# Patient Record
Sex: Female | Born: 1955 | Race: Asian | Hispanic: No | Marital: Single | Smoking: Never smoker
Health system: Southern US, Community
[De-identification: ages and names within clinical notes are randomized; demographics above are authoritative.]

## PROBLEM LIST (undated history)

## (undated) DIAGNOSIS — T7840XA Allergy, unspecified, initial encounter: Secondary | ICD-10-CM

## (undated) DIAGNOSIS — B181 Chronic viral hepatitis B without delta-agent: Secondary | ICD-10-CM

## (undated) HISTORY — DX: Allergy, unspecified, initial encounter: T78.40XA

## (undated) HISTORY — PX: BREAST SURGERY: SHX581

## (undated) HISTORY — PX: BREAST BIOPSY: SHX20

---

## 2006-01-16 ENCOUNTER — Ambulatory Visit: Payer: Self-pay

## 2006-07-18 ENCOUNTER — Ambulatory Visit: Payer: Self-pay | Admitting: Gastroenterology

## 2006-07-20 ENCOUNTER — Ambulatory Visit: Payer: Self-pay | Admitting: Gastroenterology

## 2007-01-23 ENCOUNTER — Ambulatory Visit: Payer: Self-pay

## 2007-03-14 ENCOUNTER — Ambulatory Visit: Payer: Self-pay | Admitting: Family Medicine

## 2010-09-14 ENCOUNTER — Ambulatory Visit: Payer: Self-pay | Admitting: Family Medicine

## 2012-05-29 ENCOUNTER — Ambulatory Visit: Payer: Self-pay | Admitting: Family Medicine

## 2013-03-17 ENCOUNTER — Ambulatory Visit: Payer: Self-pay | Admitting: Family Medicine

## 2014-12-09 ENCOUNTER — Ambulatory Visit: Payer: Self-pay | Admitting: Family Medicine

## 2015-03-31 ENCOUNTER — Ambulatory Visit: Payer: Self-pay | Admitting: Family Medicine

## 2015-03-31 ENCOUNTER — Encounter: Payer: Self-pay | Admitting: Family Medicine

## 2015-03-31 ENCOUNTER — Ambulatory Visit (INDEPENDENT_AMBULATORY_CARE_PROVIDER_SITE_OTHER): Payer: BLUE CROSS/BLUE SHIELD | Admitting: Family Medicine

## 2015-03-31 ENCOUNTER — Other Ambulatory Visit: Payer: Self-pay

## 2015-03-31 VITALS — BP 120/78 | HR 78 | Temp 97.6°F | Ht 64.0 in | Wt 123.0 lb

## 2015-03-31 DIAGNOSIS — D72819 Decreased white blood cell count, unspecified: Secondary | ICD-10-CM | POA: Diagnosis not present

## 2015-03-31 DIAGNOSIS — J40 Bronchitis, not specified as acute or chronic: Secondary | ICD-10-CM

## 2015-03-31 MED ORDER — CLARITHROMYCIN 250 MG PO TABS: 250.0000 mg | ORAL_TABLET | Freq: Two times a day (BID) | ORAL | Status: AC

## 2015-03-31 NOTE — Progress Notes (Signed)
Name: Tiffany Clements   MRN: [id number]    DOB: Mar 30, 1956   Date:03/31/2015       Progress Note  Subjective  Chief Complaint  Chief Complaint  Patient presents with  . Cough    got back from [place] 5 days ago after a month and half stay- cough and low grade fever started in [place]    Cough This is a recurrent problem. The current episode started more than 1 month ago. The problem has been unchanged. The problem occurs constantly. The cough is productive of purulent sputum. Associated symptoms include shortness of breath and wheezing. Pertinent negatives include no chest pain, chills, ear congestion, ear pain, fever, headaches, heartburn, hemoptysis, myalgias, nasal congestion, postnasal drip, rash, rhinorrhea, sore throat, sweats or weight loss. The symptoms are aggravated by other (air quality). Risk factors for lung disease include travel. She has tried nothing for the symptoms. Her past medical history is significant for asthma and environmental allergies. There is no history of bronchiectasis, bronchitis, COPD, emphysema or pneumonia.    No problem-specific assessment & plan notes found for this encounter.   Past Medical History  Diagnosis Date  . Allergy     History reviewed. No pertinent past surgical history.  History reviewed. No pertinent family history.  History   Social History  . Marital Status: Single    Spouse Name: N/A  . Number of Children: N/A  . Years of Education: N/A   Occupational History  . Not on file.   Social History Main Topics  . Smoking status: Never Smoker   . Smokeless tobacco: Not on file  . Alcohol Use: No  . Drug Use: No  . Sexual Activity: No   Other Topics Concern  . Not on file   Social History Narrative  . No narrative on file    No Known Allergies   Review of Systems  Constitutional: Negative for fever, chills and weight loss.  HENT: Negative for ear pain, postnasal drip, rhinorrhea and sore throat.   Respiratory: Positive for  cough, sputum production, shortness of breath and wheezing. Negative for hemoptysis.   Cardiovascular: Positive for orthopnea. Negative for chest pain, palpitations, leg swelling and PND.  Gastrointestinal: Negative for heartburn.  Musculoskeletal: Negative for myalgias.  Skin: Negative for rash.  Neurological: Negative for dizziness and headaches.  Endo/Heme/Allergies: Positive for environmental allergies.     Objective  Filed Vitals:   03/31/15 1517  BP: 120/78  Pulse: 78  Temp: 97.6 F (36.4 C)  TempSrc: Oral  Height: 5\' 4"  (1.626 m)  Weight: 123 lb (55.792 kg)    Physical Exam  Constitutional: She is well-developed, well-nourished, and in no distress.  HENT:  Head: Normocephalic and atraumatic.  Right Ear: External ear normal.  Left Ear: External ear normal.  Mouth/Throat: Oropharynx is clear and moist.  Eyes: Conjunctivae and EOM are normal. Pupils are equal, round, and reactive to light.  Neck: Normal range of motion. Neck supple.  Cardiovascular: Normal rate, regular rhythm, normal heart sounds and intact distal pulses.   No murmur heard. Pulmonary/Chest: Effort normal and breath sounds normal.  Abdominal: Soft. Bowel sounds are normal.  Musculoskeletal: Normal range of motion.  Lymphadenopathy:    She has no cervical adenopathy.  Skin: Skin is warm and dry.  Psychiatric: Mood and affect normal.      No results found for this or any previous visit (from the past 2160 hour(s)).   Assessment & Plan  Problem List Items Addressed This Visit  None    Visit Diagnoses    Bronchitis    -  Primary    breo sample given    Relevant Medications    clarithromycin (BIAXIN) 250 MG tablet    Leukopenia        Relevant Orders    CBC w/Diff         Dr. Otilio Miu [facility]  03/31/2015

## 2015-04-01 LAB — CBC WITH DIFFERENTIAL/PLATELET
BASOS ABS: 0.1 10*3/uL (ref 0.0–0.2)
BASOS: 1 %
EOS (ABSOLUTE): 0.2 10*3/uL (ref 0.0–0.4)
EOS: 3 %
HEMATOCRIT: 37.8 % (ref 34.0–46.6)
HEMOGLOBIN: 12.4 g/dL (ref 11.1–15.9)
IMMATURE GRANULOCYTES: 0 %
Immature Grans (Abs): 0 10*3/uL (ref 0.0–0.1)
Lymphocytes Absolute: 2.8 10*3/uL (ref 0.7–3.1)
Lymphs: 47 %
MCH: 30.5 pg (ref 26.6–33.0)
MCHC: 32.8 g/dL (ref 31.5–35.7)
MCV: 93 fL (ref 79–97)
MONOCYTES: 9 %
Monocytes Absolute: 0.5 10*3/uL (ref 0.1–0.9)
NEUTROS ABS: 2.4 10*3/uL (ref 1.4–7.0)
Neutrophils: 40 %
Platelets: 250 10*3/uL (ref 150–379)
RBC: 4.07 x10E6/uL (ref 3.77–5.28)
RDW: 13.7 % (ref 12.3–15.4)
WBC: 5.9 10*3/uL (ref 3.4–10.8)

## 2015-06-08 ENCOUNTER — Encounter: Payer: Self-pay | Admitting: Family Medicine

## 2015-06-08 ENCOUNTER — Ambulatory Visit (INDEPENDENT_AMBULATORY_CARE_PROVIDER_SITE_OTHER): Payer: BLUE CROSS/BLUE SHIELD | Admitting: Family Medicine

## 2015-06-08 VITALS — BP 120/80 | HR 60 | Temp 98.0°F | Ht 62.0 in | Wt 120.0 lb

## 2015-06-08 DIAGNOSIS — J019 Acute sinusitis, unspecified: Secondary | ICD-10-CM | POA: Diagnosis not present

## 2015-06-08 MED ORDER — AMOXICILLIN 500 MG PO CAPS: 500.0000 mg | ORAL_CAPSULE | Freq: Three times a day (TID) | ORAL | Status: DC

## 2015-06-08 NOTE — Progress Notes (Signed)
Name: Tiffany Clements   MRN: [id number]    DOB: [birth year]   Date:06/08/2015       Progress Note  Subjective  Chief Complaint  Chief Complaint  Patient presents with  . Sinusitis    dizzy/ nauseated  . Sore Throat    Sinusitis This is a new problem. The current episode started in the past 7 days. The problem has been gradually worsening since onset. The maximum temperature recorded prior to her arrival was 100.4 - 100.9 F. Associated symptoms include chills, headaches, sinus pressure, a sore throat and swollen glands. Pertinent negatives include no congestion, coughing, diaphoresis, ear pain, hoarse voice, neck pain, shortness of breath or sneezing. Past treatments include nothing. The treatment provided mild relief.  Sore Throat  Associated symptoms include headaches and swollen glands. Pertinent negatives include no abdominal pain, congestion, coughing, diarrhea, ear discharge, ear pain, hoarse voice, neck pain or shortness of breath.    No problem-specific assessment & plan notes found for this encounter.   Past Medical History  Diagnosis Date  . Allergy     History reviewed. No pertinent past surgical history.  History reviewed. No pertinent family history.  Social History   Social History  . Marital Status: Single    Spouse Name: N/A  . Number of Children: N/A  . Years of Education: N/A   Occupational History  . Not on file.   Social History Main Topics  . Smoking status: Never Smoker   . Smokeless tobacco: Not on file  . Alcohol Use: No  . Drug Use: No  . Sexual Activity: No   Other Topics Concern  . Not on file   Social History Narrative    No Known Allergies   Review of Systems  Constitutional: Positive for chills. Negative for fever, weight loss, malaise/fatigue and diaphoresis.  HENT: Positive for sinus pressure and sore throat. Negative for congestion, ear discharge, ear pain, hearing loss, hoarse voice and sneezing.   Eyes: Negative for blurred  vision.  Respiratory: Negative for cough, sputum production, shortness of breath and wheezing.   Cardiovascular: Negative for chest pain, palpitations and leg swelling.  Gastrointestinal: Positive for nausea. Negative for heartburn, abdominal pain, diarrhea, constipation, blood in stool and melena.  Genitourinary: Negative for dysuria, urgency, frequency and hematuria.  Musculoskeletal: Negative for myalgias, back pain, joint pain and neck pain.  Skin: Negative for rash.  Neurological: Positive for headaches. Negative for dizziness, tingling, sensory change and focal weakness.  Endo/Heme/Allergies: Negative for environmental allergies and polydipsia. Does not bruise/bleed easily.  Psychiatric/Behavioral: Negative for depression and suicidal ideas. The patient is not nervous/anxious and does not have insomnia.      Objective  Filed Vitals:   06/08/15 1339  BP: 120/80  Pulse: 60  Temp: 98 F (36.7 C)  TempSrc: Oral  Height: 5\' 2"  (1.575 m)  Weight: 120 lb (54.432 kg)    Physical Exam  Constitutional: She is well-developed, well-nourished, and in no distress. No distress.  HENT:  Head: Normocephalic and atraumatic.  Right Ear: External ear normal.  Left Ear: External ear normal.  Nose: Nose normal.  Mouth/Throat: Posterior oropharyngeal erythema present.  Eyes: Conjunctivae and EOM are normal. Pupils are equal, round, and reactive to light. Right eye exhibits no discharge. Left eye exhibits no discharge.  Neck: Normal range of motion. Neck supple. No JVD present. No thyromegaly present.  Cardiovascular: Normal rate, regular rhythm, normal heart sounds and intact distal pulses.  Exam reveals no gallop and no friction  rub.   No murmur heard. Pulmonary/Chest: Effort normal and breath sounds normal. No respiratory distress.  Abdominal: Soft. Bowel sounds are normal. She exhibits no mass. There is no tenderness. There is no guarding.  Musculoskeletal: Normal range of motion. She  exhibits no edema.  Lymphadenopathy:    She has no cervical adenopathy.  Neurological: She is alert. She has normal reflexes.  Skin: Skin is warm and dry. She is not diaphoretic.  Psychiatric: Mood and affect normal.      Assessment & Plan  Problem List Items Addressed This Visit    None    Visit Diagnoses    Acute sinusitis, recurrence not specified, unspecified location    -  Primary    Relevant Medications    amoxicillin (AMOXIL) 500 MG capsule         Dr. [facility]  06/08/2015

## 2015-06-22 ENCOUNTER — Ambulatory Visit (INDEPENDENT_AMBULATORY_CARE_PROVIDER_SITE_OTHER): Payer: BLUE CROSS/BLUE SHIELD | Admitting: Family Medicine

## 2015-06-22 ENCOUNTER — Encounter: Payer: Self-pay | Admitting: Family Medicine

## 2015-06-22 VITALS — BP 122/62 | HR 72 | Ht 62.0 in | Wt 121.0 lb

## 2015-06-22 DIAGNOSIS — S29011A Strain of muscle and tendon of front wall of thorax, initial encounter: Secondary | ICD-10-CM

## 2015-06-22 DIAGNOSIS — J029 Acute pharyngitis, unspecified: Secondary | ICD-10-CM

## 2015-06-22 DIAGNOSIS — S2341XA Sprain of ribs, initial encounter: Secondary | ICD-10-CM

## 2015-06-22 MED ORDER — AZITHROMYCIN 250 MG PO TABS: ORAL_TABLET | ORAL | Status: DC

## 2015-06-22 MED ORDER — TRAMADOL HCL 50 MG PO TABS: 50.0000 mg | ORAL_TABLET | Freq: Three times a day (TID) | ORAL | Status: DC | PRN

## 2015-06-22 NOTE — Progress Notes (Signed)
Name: Tiffany Clements   MRN: [id number]    DOB: January 08, 1956   Date:06/22/2015       Progress Note  Subjective  Chief Complaint  Chief Complaint  Patient presents with  . Breast Pain    pain off and on x couple of days    Chest Pain  This is a new problem. The current episode started in the past 7 days. The problem occurs 2 to 4 times per day. The problem has been waxing and waning. The pain is present in the lateral region. The pain is at a severity of 5/10. The pain is moderate. The quality of the pain is described as sharp (with palpation). The pain does not radiate. Pertinent negatives include no abdominal pain, back pain, cough, dizziness, fever, headaches, irregular heartbeat, malaise/fatigue, nausea, numbness, palpitations, shortness of breath or sputum production.  Sore Throat  This is a new problem. The current episode started in the past 7 days. The problem has been gradually worsening. There has been no fever. The pain is mild. Pertinent negatives include no abdominal pain, coughing, diarrhea, ear discharge, ear pain, headaches, hoarse voice, neck pain, shortness of breath or swollen glands. She has tried acetaminophen for the symptoms.    No problem-specific assessment & plan notes found for this encounter.   Past Medical History  Diagnosis Date  . Allergy     History reviewed. No pertinent past surgical history.  History reviewed. No pertinent family history.  Social History   Social History  . Marital Status: Single    Spouse Name: N/A  . Number of Children: N/A  . Years of Education: N/A   Occupational History  . Not on file.   Social History Main Topics  . Smoking status: Never Smoker   . Smokeless tobacco: Not on file  . Alcohol Use: No  . Drug Use: No  . Sexual Activity: No   Other Topics Concern  . Not on file   Social History Narrative    No Known Allergies   Review of Systems  Constitutional: Negative for fever, chills, weight loss and  malaise/fatigue.  HENT: Positive for sore throat. Negative for ear discharge, ear pain and hoarse voice.   Eyes: Negative for blurred vision.  Respiratory: Negative for cough, sputum production, shortness of breath and wheezing.   Cardiovascular: Positive for chest pain. Negative for palpitations and leg swelling.       Chest wall pain/tenderness beneath left breast  Gastrointestinal: Negative for heartburn, nausea, abdominal pain, diarrhea, constipation, blood in stool and melena.  Genitourinary: Negative for dysuria, urgency, frequency and hematuria.  Musculoskeletal: Negative for myalgias, back pain, joint pain and neck pain.  Skin: Negative for rash.  Neurological: Negative for dizziness, tingling, sensory change, focal weakness, numbness and headaches.  Endo/Heme/Allergies: Negative for environmental allergies and polydipsia. Does not bruise/bleed easily.  Psychiatric/Behavioral: Negative for depression and suicidal ideas. The patient is not nervous/anxious and does not have insomnia.      Objective  Filed Vitals:   06/22/15 1517  BP: 122/62  Pulse: 72  Height: 5\' 2"  (1.575 m)  Weight: 121 lb (54.885 kg)    Physical Exam  Constitutional: She is well-developed, well-nourished, and in no distress. No distress.  HENT:  Head: Normocephalic and atraumatic.  Right Ear: External ear normal.  Left Ear: External ear normal.  Nose: Nose normal.  Mouth/Throat: Posterior oropharyngeal erythema present.  Eyes: Conjunctivae and EOM are normal. Pupils are equal, round, and reactive to light. Right eye  exhibits no discharge. Left eye exhibits no discharge.  Neck: Normal range of motion. Neck supple. No JVD present. No thyromegaly present.  Cardiovascular: Normal rate, regular rhythm, normal heart sounds and intact distal pulses.  Exam reveals no gallop and no friction rub.   No murmur heard. Pulmonary/Chest: Effort normal and breath sounds normal. She exhibits tenderness and bony  tenderness. She exhibits no edema, no deformity and no swelling. Right breast exhibits no mass, no skin change and no tenderness. Left breast exhibits no mass, no skin change and no tenderness.    Abdominal: Soft. Bowel sounds are normal. She exhibits no mass. There is no tenderness. There is no guarding.  Musculoskeletal: Normal range of motion. She exhibits no edema.  Lymphadenopathy:    She has no cervical adenopathy.  Neurological: She is alert. She has normal reflexes.  Skin: Skin is warm and dry. She is not diaphoretic.  Psychiatric: Mood and affect normal.      Assessment & Plan  Problem List Items Addressed This Visit    None    Visit Diagnoses    Intercostal muscle strain, initial encounter    -  Primary    may take aleve    Relevant Medications    traMADol (ULTRAM) 50 MG tablet    Pharyngitis        Relevant Medications    azithromycin (ZITHROMAX) 250 MG tablet         Dr. [facility]  06/22/2015

## 2015-09-15 ENCOUNTER — Ambulatory Visit (INDEPENDENT_AMBULATORY_CARE_PROVIDER_SITE_OTHER): Payer: BLUE CROSS/BLUE SHIELD | Admitting: Family Medicine

## 2015-09-15 ENCOUNTER — Encounter: Payer: Self-pay | Admitting: Family Medicine

## 2015-09-15 VITALS — BP 110/70 | HR 78 | Temp 98.3°F | Ht 62.0 in | Wt 121.0 lb

## 2015-09-15 DIAGNOSIS — M940 Chondrocostal junction syndrome [Tietze]: Secondary | ICD-10-CM

## 2015-09-15 DIAGNOSIS — J029 Acute pharyngitis, unspecified: Secondary | ICD-10-CM

## 2015-09-15 DIAGNOSIS — J40 Bronchitis, not specified as acute or chronic: Secondary | ICD-10-CM | POA: Diagnosis not present

## 2015-09-15 DIAGNOSIS — J0101 Acute recurrent maxillary sinusitis: Secondary | ICD-10-CM | POA: Diagnosis not present

## 2015-09-15 MED ORDER — AZITHROMYCIN 250 MG PO TABS: ORAL_TABLET | ORAL | Status: DC

## 2015-09-15 MED ORDER — GUAIFENESIN-CODEINE 100-10 MG/5ML PO SOLN: 5.0000 mL | Freq: Three times a day (TID) | ORAL | Status: DC | PRN

## 2015-09-15 NOTE — Progress Notes (Signed)
Name: Tiffany Clements   MRN: [id number]    DOB: [birth year]   Date:09/15/2015       Progress Note  Subjective  Chief Complaint  Chief Complaint  Patient presents with  . Sinusitis    cough, chills, sore throat, runny nose x 2 days    Sinusitis This is a new problem. The current episode started in the past 7 days. The problem has been waxing and waning since onset. There has been no fever. Associated symptoms include chills, congestion, coughing, headaches, shortness of breath, sinus pressure and a sore throat. Pertinent negatives include no diaphoresis, ear pain, hoarse voice, neck pain, sneezing or swollen glands. Past treatments include acetaminophen. The treatment provided no relief.  Cough The current episode started in the past 7 days. The problem has been waxing and waning. The cough is non-productive. Associated symptoms include chills, headaches, a sore throat and shortness of breath. Pertinent negatives include no chest pain, ear pain, fever, heartburn, myalgias, rash, weight loss or wheezing. The symptoms are aggravated by cold air. The treatment provided no relief. There is no history of environmental allergies.    No problem-specific assessment & plan notes found for this encounter.   Past Medical History  Diagnosis Date  . Allergy     History reviewed. No pertinent past surgical history.  History reviewed. No pertinent family history.  Social History   Social History  . Marital Status: Single    Spouse Name: N/A  . Number of Children: N/A  . Years of Education: N/A   Occupational History  . Not on file.   Social History Main Topics  . Smoking status: Never Smoker   . Smokeless tobacco: Not on file  . Alcohol Use: No  . Drug Use: No  . Sexual Activity: No   Other Topics Concern  . Not on file   Social History Narrative    No Known Allergies   Review of Systems  Constitutional: Positive for chills. Negative for fever, weight loss, malaise/fatigue and  diaphoresis.  HENT: Positive for congestion, sinus pressure and sore throat. Negative for ear discharge, ear pain, hoarse voice and sneezing.   Eyes: Negative for blurred vision.  Respiratory: Positive for cough and shortness of breath. Negative for sputum production and wheezing.   Cardiovascular: Negative for chest pain, palpitations and leg swelling.  Gastrointestinal: Negative for heartburn, nausea, abdominal pain, diarrhea, constipation, blood in stool and melena.  Genitourinary: Negative for dysuria, urgency, frequency and hematuria.  Musculoskeletal: Negative for myalgias, back pain, joint pain and neck pain.  Skin: Negative for rash.  Neurological: Positive for headaches. Negative for dizziness, tingling, sensory change and focal weakness.  Endo/Heme/Allergies: Negative for environmental allergies and polydipsia. Does not bruise/bleed easily.  Psychiatric/Behavioral: Negative for depression and suicidal ideas. The patient is not nervous/anxious and does not have insomnia.      Objective  Filed Vitals:   09/15/15 1413  BP: 110/70  Pulse: 78  Temp: 98.3 F (36.8 C)  TempSrc: Oral  Height: 5\' 2"  (1.575 m)  Weight: 121 lb (54.885 kg)    Physical Exam  Constitutional: She is well-developed, well-nourished, and in no distress. No distress.  HENT:  Head: Normocephalic and atraumatic.  Right Ear: External ear normal.  Left Ear: External ear normal.  Nose: Nose normal.  Mouth/Throat: Oropharynx is clear and moist.  Eyes: Conjunctivae and EOM are normal. Pupils are equal, round, and reactive to light. Right eye exhibits no discharge. Left eye exhibits no discharge.  Neck:  Normal range of motion. Neck supple. No JVD present. No thyromegaly present.  Cardiovascular: Normal rate, regular rhythm, normal heart sounds and intact distal pulses.  Exam reveals no gallop and no friction rub.   No murmur heard. Pulmonary/Chest: Effort normal and breath sounds normal. No respiratory  distress. She has no wheezes. She has no rales. She exhibits tenderness.  Abdominal: Soft. Bowel sounds are normal. She exhibits no mass. There is no tenderness. There is no guarding.  Musculoskeletal: Normal range of motion. She exhibits no edema.  Lymphadenopathy:    She has no cervical adenopathy.  Neurological: She is alert. She has normal reflexes.  Skin: Skin is warm and dry. She is not diaphoretic.  Psychiatric: Mood and affect normal.      Assessment & Plan  Problem List Items Addressed This Visit    None    Visit Diagnoses    Bronchitis    -  Primary    Relevant Medications    guaiFENesin-codeine 100-10 MG/5ML syrup    Acute recurrent maxillary sinusitis        Relevant Medications    guaiFENesin-codeine 100-10 MG/5ML syrup    azithromycin (ZITHROMAX) 250 MG tablet    Costochondritis        Pharyngitis        Relevant Medications    azithromycin (ZITHROMAX) 250 MG tablet         Dr. [facility]  09/15/2015

## 2015-09-21 ENCOUNTER — Ambulatory Visit (INDEPENDENT_AMBULATORY_CARE_PROVIDER_SITE_OTHER): Payer: BLUE CROSS/BLUE SHIELD | Admitting: Family Medicine

## 2015-09-21 ENCOUNTER — Encounter: Payer: Self-pay | Admitting: Family Medicine

## 2015-09-21 VITALS — BP 122/80 | HR 74 | Temp 97.7°F | Ht 62.0 in | Wt 123.0 lb

## 2015-09-21 DIAGNOSIS — D72819 Decreased white blood cell count, unspecified: Secondary | ICD-10-CM

## 2015-09-21 DIAGNOSIS — L042 Acute lymphadenitis of upper limb: Secondary | ICD-10-CM | POA: Diagnosis not present

## 2015-09-21 DIAGNOSIS — L03012 Cellulitis of left finger: Secondary | ICD-10-CM

## 2015-09-21 DIAGNOSIS — S61341A Puncture wound with foreign body of left index finger with damage to nail, initial encounter: Secondary | ICD-10-CM | POA: Diagnosis not present

## 2015-09-21 MED ORDER — SULFAMETHOXAZOLE-TRIMETHOPRIM 800-160 MG PO TABS: 1.0000 | ORAL_TABLET | Freq: Two times a day (BID) | ORAL | Status: DC

## 2015-09-21 NOTE — Progress Notes (Signed)
Name: Tiffany Clements   MRN: [id number]    DOB: 1956/09/01   Date:09/21/2015       Progress Note  Subjective  Chief Complaint  Chief Complaint  Patient presents with  . Hand Pain    "poked finger with a fish bone 15 days ago"- now red and irritated and has noticed some sweeling on the inside of L) arm    Hand Pain  The incident occurred 3 to 5 days ago. Injury mechanism: puncture wound left index finger distal/dorsul aspect. The pain is present in the left fingers and left elbow. The quality of the pain is described as aching. The pain is at a severity of 4/10. The pain is mild. Pertinent negatives include no chest pain or tingling. The treatment provided moderate relief.    No problem-specific assessment & plan notes found for this encounter.   Past Medical History  Diagnosis Date  . Allergy     History reviewed. No pertinent past surgical history.  History reviewed. No pertinent family history.  Social History   Social History  . Marital Status: Single    Spouse Name: N/A  . Number of Children: N/A  . Years of Education: N/A   Occupational History  . Not on file.   Social History Main Topics  . Smoking status: Never Smoker   . Smokeless tobacco: Not on file  . Alcohol Use: No  . Drug Use: No  . Sexual Activity: No   Other Topics Concern  . Not on file   Social History Narrative    No Known Allergies   Review of Systems  Constitutional: Negative for fever, chills, weight loss and malaise/fatigue.  HENT: Negative for ear discharge, ear pain and sore throat.   Eyes: Negative for blurred vision.  Respiratory: Negative for cough, sputum production, shortness of breath and wheezing.   Cardiovascular: Negative for chest pain, palpitations and leg swelling.  Gastrointestinal: Negative for heartburn, nausea, abdominal pain, diarrhea, constipation, blood in stool and melena.  Genitourinary: Negative for dysuria, urgency, frequency and hematuria.  Musculoskeletal:  Negative for myalgias, back pain, joint pain and neck pain.  Skin: Negative for rash.  Neurological: Negative for dizziness, tingling, sensory change, focal weakness and headaches.  Endo/Heme/Allergies: Negative for environmental allergies and polydipsia. Does not bruise/bleed easily.  Psychiatric/Behavioral: Negative for depression and suicidal ideas. The patient is not nervous/anxious and does not have insomnia.      Objective  Filed Vitals:   09/21/15 1045  BP: 122/80  Pulse: 74  Temp: 97.7 F (36.5 C)  TempSrc: Oral  Height: 5\' 2"  (1.575 m)  Weight: 123 lb (55.792 kg)    Physical Exam  Constitutional: She is well-developed, well-nourished, and in no distress. No distress.  HENT:  Head: Normocephalic and atraumatic.  Right Ear: External ear normal.  Left Ear: External ear normal.  Nose: Nose normal.  Mouth/Throat: Oropharynx is clear and moist.  Eyes: Conjunctivae and EOM are normal. Pupils are equal, round, and reactive to light. Right eye exhibits no discharge. Left eye exhibits no discharge.  Neck: Normal range of motion. Neck supple. No JVD present. No thyromegaly present.  Cardiovascular: Normal rate, regular rhythm, normal heart sounds and intact distal pulses.  Exam reveals no gallop and no friction rub.   No murmur heard. Pulmonary/Chest: Effort normal and breath sounds normal.  Abdominal: Soft. Bowel sounds are normal. She exhibits no mass. There is no tenderness. There is no guarding.  Musculoskeletal: Normal range of motion. She exhibits no  edema.       Left elbow: She exhibits swelling. Tenderness found.       Arms:      Left hand: She exhibits tenderness and swelling.       Hands: Palpable epithroceler node/tender  Lymphadenopathy:    She has no cervical adenopathy.  Neurological: She is alert.  Skin: Skin is warm and dry. No rash noted. She is not diaphoretic. There is erythema. No pallor.  Psychiatric: Mood and affect normal.      Assessment &  Plan  Problem List Items Addressed This Visit    None    Visit Diagnoses    Puncture wound of left index finger with foreign body and damage to nail, initial encounter    -  Primary    Relevant Medications    sulfamethoxazole-trimethoprim (BACTRIM DS,SEPTRA DS) 800-160 MG tablet    Cellulitis of finger of left hand        Relevant Medications    sulfamethoxazole-trimethoprim (BACTRIM DS,SEPTRA DS) 800-160 MG tablet    Other Relevant Orders    CBC with Differential    Acute lymphadenitis of arm        Relevant Medications    sulfamethoxazole-trimethoprim (BACTRIM DS,SEPTRA DS) 800-160 MG tablet    Leukopenia        Relevant Orders    CBC with Differential         Dr. Chistopher [facility]  09/21/2015

## 2015-09-21 NOTE — Patient Instructions (Signed)

## 2015-09-22 LAB — CBC WITH DIFFERENTIAL/PLATELET
Basophils Absolute: 0.1 10*3/uL (ref 0.0–0.2)
Basos: 2 %
EOS (ABSOLUTE): 0.1 10*3/uL (ref 0.0–0.4)
EOS: 2 %
HEMATOCRIT: 38.8 % (ref 34.0–46.6)
HEMOGLOBIN: 12.9 g/dL (ref 11.1–15.9)
IMMATURE GRANS (ABS): 0 10*3/uL (ref 0.0–0.1)
Immature Granulocytes: 0 %
Lymphocytes Absolute: 1.9 10*3/uL (ref 0.7–3.1)
Lymphs: 40 %
MCH: 32.2 pg (ref 26.6–33.0)
MCHC: 33.2 g/dL (ref 31.5–35.7)
MCV: 97 fL (ref 79–97)
MONOCYTES: 12 %
MONOS ABS: 0.6 10*3/uL (ref 0.1–0.9)
NEUTROS PCT: 44 %
Neutrophils Absolute: 2.1 10*3/uL (ref 1.4–7.0)
Platelets: 238 10*3/uL (ref 150–379)
RBC: 4.01 x10E6/uL (ref 3.77–5.28)
RDW: 13.3 % (ref 12.3–15.4)
WBC: 4.6 10*3/uL (ref 3.4–10.8)

## 2015-10-01 ENCOUNTER — Ambulatory Visit (INDEPENDENT_AMBULATORY_CARE_PROVIDER_SITE_OTHER): Payer: BLUE CROSS/BLUE SHIELD | Admitting: Family Medicine

## 2015-10-01 ENCOUNTER — Encounter: Payer: Self-pay | Admitting: Family Medicine

## 2015-10-01 VITALS — BP 120/80 | HR 64 | Ht 62.0 in | Wt 123.0 lb

## 2015-10-01 DIAGNOSIS — S61239D Puncture wound without foreign body of unspecified finger without damage to nail, subsequent encounter: Secondary | ICD-10-CM | POA: Diagnosis not present

## 2015-10-01 NOTE — Progress Notes (Signed)
Name: Tiffany Clements   MRN: [id number]    DOB: [birth year]   Date:10/01/2015       Progress Note  Subjective  Chief Complaint  Chief Complaint  Patient presents with  . Follow-up    L) arm was swollen- took last antibiotic this am    Hand Pain  The incident occurred 3 to 5 days ago. The pain is present in the left fingers. The pain is at a severity of 0/10. The patient is experiencing no pain. Pertinent negatives include no chest pain, muscle weakness, numbness or tingling.    No problem-specific assessment & plan notes found for this encounter.   Past Medical History  Diagnosis Date  . Allergy     History reviewed. No pertinent past surgical history.  History reviewed. No pertinent family history.  Social History   Social History  . Marital Status: Single    Spouse Name: N/A  . Number of Children: N/A  . Years of Education: N/A   Occupational History  . Not on file.   Social History Main Topics  . Smoking status: Never Smoker   . Smokeless tobacco: Not on file  . Alcohol Use: No  . Drug Use: No  . Sexual Activity: No   Other Topics Concern  . Not on file   Social History Narrative    No Known Allergies   Review of Systems  Constitutional: Negative for fever, chills, weight loss and malaise/fatigue.  HENT: Negative for ear discharge, ear pain and sore throat.   Eyes: Negative for blurred vision.  Respiratory: Negative for cough, sputum production, shortness of breath and wheezing.   Cardiovascular: Negative for chest pain, palpitations and leg swelling.  Gastrointestinal: Negative for heartburn, nausea, abdominal pain, diarrhea, constipation, blood in stool and melena.  Genitourinary: Negative for dysuria, urgency, frequency and hematuria.  Musculoskeletal: Negative for myalgias, back pain, joint pain and neck pain.  Skin: Negative for rash.  Neurological: Negative for dizziness, tingling, sensory change, focal weakness, numbness and headaches.   Endo/Heme/Allergies: Negative for environmental allergies and polydipsia. Does not bruise/bleed easily.  Psychiatric/Behavioral: Negative for depression and suicidal ideas. The patient is not nervous/anxious and does not have insomnia.      Objective  Filed Vitals:   10/01/15 1354  BP: 120/80  Pulse: 64  Height: 5\' 2"  (1.575 m)  Weight: 123 lb (55.792 kg)    Physical Exam  Musculoskeletal: Normal range of motion. She exhibits no edema or tenderness.  Neurological: She has normal strength and normal reflexes.      Assessment & Plan  Problem List Items Addressed This Visit    None    Visit Diagnoses    Puncture wound of finger of left hand, subsequent encounter    -  Primary         Dr. Otilio Miu [facility]  10/01/2015

## 2015-10-21 ENCOUNTER — Other Ambulatory Visit: Payer: Self-pay

## 2015-10-22 ENCOUNTER — Encounter: Payer: Self-pay | Admitting: Family Medicine

## 2015-10-22 ENCOUNTER — Encounter: Payer: Self-pay | Admitting: Gastroenterology

## 2015-10-22 ENCOUNTER — Ambulatory Visit (INDEPENDENT_AMBULATORY_CARE_PROVIDER_SITE_OTHER): Payer: BLUE CROSS/BLUE SHIELD | Admitting: Family Medicine

## 2015-10-22 VITALS — BP 110/80 | HR 80 | Ht 62.0 in | Wt 122.0 lb

## 2015-10-22 DIAGNOSIS — R109 Unspecified abdominal pain: Secondary | ICD-10-CM | POA: Diagnosis not present

## 2015-10-22 NOTE — Progress Notes (Signed)
Name: Tiffany Clements   MRN: [id number]    DOB: Jun 13, 1956   Date:10/22/2015       Progress Note  Subjective  Chief Complaint  Chief Complaint  Patient presents with  . Abdominal Pain    upper, mid section of abdomen- pressure like pain x 3 days- BM normal    Abdominal Pain This is a recurrent problem. The current episode started in the past 7 days. The onset quality is gradual. The problem occurs constantly. The problem has been waxing and waning. The pain is located in the epigastric region. The pain is at a severity of 5/10. The pain is moderate. The quality of the pain is colicky. Pertinent negatives include no constipation, diarrhea, dysuria, fever, frequency, headaches, hematochezia, hematuria, melena, myalgias, nausea or weight loss. Nothing aggravates the pain. She has tried nothing for the symptoms. Prior diagnostic workup includes ultrasound. There is no history of abdominal surgery.    No problem-specific assessment & plan notes found for this encounter.   Past Medical History  Diagnosis Date  . Allergy     History reviewed. No pertinent past surgical history.  History reviewed. No pertinent family history.  Social History   Social History  . Marital Status: Single    Spouse Name: N/A  . Number of Children: N/A  . Years of Education: N/A   Occupational History  . Not on file.   Social History Main Topics  . Smoking status: Never Smoker   . Smokeless tobacco: Not on file  . Alcohol Use: No  . Drug Use: No  . Sexual Activity: No   Other Topics Concern  . Not on file   Social History Narrative    No Known Allergies   Review of Systems  Constitutional: Negative for fever, chills, weight loss and malaise/fatigue.  HENT: Negative for ear discharge, ear pain and sore throat.   Eyes: Negative for blurred vision.  Respiratory: Negative for cough, sputum production, shortness of breath and wheezing.   Cardiovascular: Negative for chest pain, palpitations and  leg swelling.  Gastrointestinal: Positive for abdominal pain. Negative for heartburn, nausea, diarrhea, constipation, blood in stool, melena and hematochezia.  Genitourinary: Negative for dysuria, urgency, frequency and hematuria.  Musculoskeletal: Negative for myalgias, back pain, joint pain and neck pain.  Skin: Negative for rash.  Neurological: Negative for dizziness, tingling, sensory change, focal weakness and headaches.  Endo/Heme/Allergies: Negative for environmental allergies and polydipsia. Does not bruise/bleed easily.  Psychiatric/Behavioral: Negative for depression and suicidal ideas. The patient is not nervous/anxious and does not have insomnia.      Objective  Filed Vitals:   10/22/15 1341  BP: 110/80  Pulse: 80  Height: 5\' 2"  (1.575 m)  Weight: 122 lb (55.339 kg)    Physical Exam  Constitutional: She is well-developed, well-nourished, and in no distress. No distress.  HENT:  Head: Normocephalic and atraumatic.  Right Ear: External ear normal.  Left Ear: External ear normal.  Nose: Nose normal.  Mouth/Throat: Oropharynx is clear and moist.  Eyes: Conjunctivae and EOM are normal. Pupils are equal, round, and reactive to light. Right eye exhibits no discharge. Left eye exhibits no discharge.  Neck: Normal range of motion. Neck supple. No JVD present. No thyromegaly present.  Cardiovascular: Normal rate, regular rhythm, normal heart sounds and intact distal pulses.  Exam reveals no gallop and no friction rub.   No murmur heard. Pulmonary/Chest: Effort normal and breath sounds normal.  Abdominal: Soft. Bowel sounds are normal. She exhibits no  mass. There is no tenderness. There is no guarding.  Musculoskeletal: Normal range of motion. She exhibits no edema.  Lymphadenopathy:    She has no cervical adenopathy.  Neurological: She is alert. She has normal reflexes.  Skin: Skin is warm and dry. She is not diaphoretic.  Psychiatric: Mood and affect normal.  Nursing note  and vitals reviewed.     Assessment & Plan  Problem List Items Addressed This Visit    None    Visit Diagnoses    Abdominal pain in female    -  Primary    Relevant Orders    Ambulatory referral to Gastroenterology         Dr. Otilio Miu [facility]  10/22/2015

## 2015-11-10 ENCOUNTER — Ambulatory Visit: Payer: BLUE CROSS/BLUE SHIELD | Admitting: Gastroenterology

## 2015-11-16 ENCOUNTER — Other Ambulatory Visit: Payer: Self-pay

## 2016-04-01 ENCOUNTER — Encounter: Payer: Self-pay | Admitting: Family Medicine

## 2016-04-01 ENCOUNTER — Ambulatory Visit (INDEPENDENT_AMBULATORY_CARE_PROVIDER_SITE_OTHER): Payer: BLUE CROSS/BLUE SHIELD | Admitting: Family Medicine

## 2016-04-01 VITALS — BP 120/70 | HR 60 | Ht 62.0 in | Wt 121.0 lb

## 2016-04-01 DIAGNOSIS — R1011 Right upper quadrant pain: Secondary | ICD-10-CM | POA: Diagnosis not present

## 2016-04-01 DIAGNOSIS — L918 Other hypertrophic disorders of the skin: Secondary | ICD-10-CM | POA: Diagnosis not present

## 2016-04-01 DIAGNOSIS — Z1159 Encounter for screening for other viral diseases: Secondary | ICD-10-CM | POA: Diagnosis not present

## 2016-04-01 DIAGNOSIS — Z8619 Personal history of other infectious and parasitic diseases: Secondary | ICD-10-CM

## 2016-04-01 DIAGNOSIS — L259 Unspecified contact dermatitis, unspecified cause: Secondary | ICD-10-CM

## 2016-04-01 MED ORDER — TRIAMCINOLONE ACETONIDE 0.1 % EX CREA: 1.0000 "application " | TOPICAL_CREAM | Freq: Two times a day (BID) | CUTANEOUS | Status: DC

## 2016-04-01 NOTE — Progress Notes (Signed)
Name: Tiffany Clements   MRN: [id number]    DOB: [birth year]   Date:04/01/2016       Progress Note  Subjective  Chief Complaint  Chief Complaint  Patient presents with  . Rash    itching on neck  . Abdominal Pain    wants a colonoscopy/ seen GI in past    HPI Comments: Patient has history of hepatitis B.  Rash This is a new problem. The current episode started in the past 7 days. The problem has been gradually worsening since onset. The affected locations include the neck. The rash is characterized by itchiness. She was exposed to nothing. Pertinent negatives include no cough, diarrhea, fever, joint pain, shortness of breath or sore throat. Past treatments include nothing. The treatment provided no relief.  Abdominal Pain This is a recurrent problem. The pain is located in the RUQ. The pain is at a severity of 3/10. The pain is mild. The quality of the pain is aching. The abdominal pain does not radiate. Associated symptoms include constipation. Pertinent negatives include no arthralgias, belching, diarrhea, dysuria, fever, frequency, headaches, hematuria, melena, myalgias, nausea or weight loss. Exacerbated by: certain foods. The pain is relieved by nothing. She has tried nothing for the symptoms. Prior workup: gi consult.    No problem-specific assessment & plan notes found for this encounter.   Past Medical History  Diagnosis Date  . Allergy     No past surgical history on file.  No family history on file.  Social History   Social History  . Marital Status: Single    Spouse Name: N/A  . Number of Children: N/A  . Years of Education: N/A   Occupational History  . Not on file.   Social History Main Topics  . Smoking status: Never Smoker   . Smokeless tobacco: Not on file  . Alcohol Use: No  . Drug Use: No  . Sexual Activity: No   Other Topics Concern  . Not on file   Social History Narrative    No Known Allergies   Review of Systems  Constitutional: Negative  for fever, chills, weight loss and malaise/fatigue.  HENT: Negative for ear discharge, ear pain and sore throat.   Eyes: Negative for blurred vision.  Respiratory: Negative for cough, sputum production, shortness of breath and wheezing.   Cardiovascular: Negative for chest pain, palpitations and leg swelling.  Gastrointestinal: Positive for abdominal pain and constipation. Negative for heartburn, nausea, diarrhea, blood in stool and melena.  Genitourinary: Negative for dysuria, urgency, frequency and hematuria.  Musculoskeletal: Negative for myalgias, back pain, joint pain, arthralgias and neck pain.  Skin: Positive for rash.  Neurological: Negative for dizziness, tingling, sensory change, focal weakness and headaches.  Endo/Heme/Allergies: Negative for environmental allergies and polydipsia. Does not bruise/bleed easily.  Psychiatric/Behavioral: Negative for depression and suicidal ideas. The patient is not nervous/anxious and does not have insomnia.      Objective  Filed Vitals:   04/01/16 1056  BP: 120/70  Pulse: 60  Height: 5\' 2"  (1.575 m)  Weight: 121 lb (54.885 kg)    Physical Exam  Constitutional: She is well-developed, well-nourished, and in no distress. No distress.  HENT:  Head: Normocephalic and atraumatic.  Right Ear: External ear normal.  Left Ear: External ear normal.  Nose: Nose normal.  Mouth/Throat: Oropharynx is clear and moist.  Eyes: Conjunctivae and EOM are normal. Pupils are equal, round, and reactive to light. Right eye exhibits no discharge. Left eye exhibits no discharge.  Neck: Normal range of motion. Neck supple. No JVD present. No thyromegaly present.  Cardiovascular: Normal rate, regular rhythm, normal heart sounds and intact distal pulses.  Exam reveals no gallop and no friction rub.   No murmur heard. Pulmonary/Chest: Effort normal and breath sounds normal.  Abdominal: Soft. Bowel sounds are normal. She exhibits no mass. There is no  hepatosplenomegaly. There is tenderness in the right upper quadrant. There is no rigidity, no rebound and no guarding.  Musculoskeletal: Normal range of motion. She exhibits no edema.  Lymphadenopathy:    She has no cervical adenopathy.  Neurological: She is alert. She has normal reflexes.  Skin: Skin is warm and dry. She is not diaphoretic.  Psychiatric: Mood and affect normal.  Nursing note and vitals reviewed.     Assessment & Plan  Problem List Items Addressed This Visit    None    Visit Diagnoses    Right upper quadrant pain    -  Primary    Relevant Orders    Hepatic function panel    Lipase    Hepatitis C antibody    Ambulatory referral to Gastroenterology    History of hepatitis B        Relevant Orders    Hepatic function panel    Lipase    Hepatitis B Surface AntiBODY    Hepatitis B Surface AntiGEN    Contact dermatitis        Relevant Medications    triamcinolone cream (KENALOG) 0.1 %    Cutaneous skin tags        Need for hepatitis C screening test        Relevant Orders    Hepatitis C antibody         Dr. Ramal Eckhardt [facility]  04/01/2016

## 2016-04-04 LAB — HEPATIC FUNCTION PANEL
ALBUMIN: 4.3 g/dL (ref 3.6–4.8)
ALT: 16 IU/L (ref 0–32)
AST: 19 IU/L (ref 0–40)
Alkaline Phosphatase: 95 IU/L (ref 39–117)
BILIRUBIN TOTAL: 0.4 mg/dL (ref 0.0–1.2)
BILIRUBIN, DIRECT: 0.12 mg/dL (ref 0.00–0.40)
Total Protein: 7.8 g/dL (ref 6.0–8.5)

## 2016-04-04 LAB — HEPATITIS C ANTIBODY

## 2016-04-04 LAB — HEPATITIS B SURFACE ANTIGEN

## 2016-04-04 LAB — HEPATITIS B SURFACE ANTIBODY,QUALITATIVE: HEP B SURFACE AB, QUAL: NONREACTIVE

## 2016-04-04 LAB — HEPATITIS B SURFACE AG, CONFIRM: HBsAg Confirmation: POSITIVE — AB

## 2016-04-04 LAB — LIPASE: Lipase: 49 U/L (ref 0–59)

## 2016-04-06 ENCOUNTER — Other Ambulatory Visit: Payer: Self-pay

## 2016-04-18 ENCOUNTER — Encounter: Payer: Self-pay | Admitting: Family Medicine

## 2016-04-18 ENCOUNTER — Ambulatory Visit (INDEPENDENT_AMBULATORY_CARE_PROVIDER_SITE_OTHER): Payer: BLUE CROSS/BLUE SHIELD | Admitting: Family Medicine

## 2016-04-18 VITALS — BP 120/80 | HR 80 | Resp 12 | Ht 62.0 in | Wt 120.0 lb

## 2016-04-18 DIAGNOSIS — Z1239 Encounter for other screening for malignant neoplasm of breast: Secondary | ICD-10-CM

## 2016-04-18 DIAGNOSIS — Z23 Encounter for immunization: Secondary | ICD-10-CM | POA: Diagnosis not present

## 2016-04-18 DIAGNOSIS — Z0000 Encounter for general adult medical examination without abnormal findings: Secondary | ICD-10-CM

## 2016-04-18 DIAGNOSIS — Z1211 Encounter for screening for malignant neoplasm of colon: Secondary | ICD-10-CM

## 2016-04-18 DIAGNOSIS — Z13220 Encounter for screening for lipoid disorders: Secondary | ICD-10-CM | POA: Diagnosis not present

## 2016-04-18 LAB — HEMOCCULT GUIAC POC 1CARD (OFFICE): FECAL OCCULT BLD: NEGATIVE

## 2016-04-18 NOTE — Patient Instructions (Signed)
Hepatitis B Antigen and Antibody Tests WHY AM I HAVING THIS TEST? Testing for hepatitis B virus (HBV) can include checking your blood for infection by the virus (antigen) itself or for hepatitis B antibodies. Hepatitis B antibodies are infection-fighting proteins produced by your body's defense (immune) system in response to an infection with HBV. These antibodies are also produced after you have received the hepatitis B vaccine. Different types of HBV antibodies may be present in your blood, depending on how long it has been since infection or vaccination occurred. Different types of HBV antigens can also be detected. The hepatitis B antigen and antibody tests detect the presence of these antigens or antibodies. These tests may be done:  If there is concern that you have been exposed to hepatitis B.  To diagnose hepatitis B infection.  If you have long-term (chronic) liver disease or abnormal liver function test results. This test may help to find the cause.  To see if you are already protected from (immune to) hepatitis B.  To see if you need the hepatitis B vaccine to protect you from future infection. WHAT KIND OF SAMPLE IS TAKEN? A blood sample is required for the hepatitis B antigen and antibody tests. It is usually collected by inserting a needle into a vein. HOW DO I PREPARE FOR THE TEST? There is no preparation required for this test. WHAT DO THE RESULTS MEAN? It is your responsibility to obtain your test results. Ask the lab or department performing the test when and how you will get your results. Talk with your health care provider if you have any questions about your results. The test results are based on which antibodies your blood has (is positive for) and which antibodies your blood does not have (is negative for). The test results are also based on whether hepatitis B antigen molecules are found in your blood. Normal Test Results The test result is normal if it matches one of  these descriptions:  Negative HBsAg and HBeAg. This means no HBV antigen is present in your blood.  Positive HBsAb. This means you do not have an active infection but that you are protected from HBV from past exposure or from vaccination. Abnormal Test Results The test result is abnormal and may indicate current or recent HBV infection if it matches one of these descriptions:  Positive HBeAg and HBsAg. This means you have a current HBV infection.  Positive HBVc-Ab/IgM or HBVc-Ab total. This means you have a current, previous, or long-term HBV infection. Discuss your test results with your health care provider. He or she will use the results to make a diagnosis and determine a treatment plan that is right for you.   This information is not intended to replace advice given to you by your health care provider. Make sure you discuss any questions you have with your health care provider.   Document Released: 11/04/2004 Document Revised: 10/31/2014 Document Reviewed: 02/25/2014 Elsevier Interactive Patient Education [employer].

## 2016-04-18 NOTE — Progress Notes (Signed)
Name: Tiffany Clements   MRN: [id number]    DOB: 29-Nov-1955   Date:04/18/2016       Progress Note  Subjective  Chief Complaint  Chief Complaint  Patient presents with  . Annual Exam    mammogram    HPI Comments: Patient presents for annual physical exam.    No problem-specific assessment & plan notes found for this encounter.   Past Medical History  Diagnosis Date  . Allergy     No past surgical history on file.  No family history on file.  Social History   Social History  . Marital Status: Single    Spouse Name: N/A  . Number of Children: N/A  . Years of Education: N/A   Occupational History  . Not on file.   Social History Main Topics  . Smoking status: Never Smoker   . Smokeless tobacco: Not on file  . Alcohol Use: No  . Drug Use: No  . Sexual Activity: No   Other Topics Concern  . Not on file   Social History Narrative    No Known Allergies   Review of Systems  Constitutional: Negative for fever, chills, weight loss and malaise/fatigue.  HENT: Negative for ear discharge, ear pain and sore throat.   Eyes: Negative for blurred vision.  Respiratory: Negative for cough, sputum production, shortness of breath and wheezing.   Cardiovascular: Negative for chest pain, palpitations and leg swelling.  Gastrointestinal: Positive for abdominal pain. Negative for heartburn, nausea, diarrhea, constipation, blood in stool and melena.  Genitourinary: Negative for dysuria, urgency, frequency and hematuria.  Musculoskeletal: Negative for myalgias, back pain, joint pain and neck pain.  Skin: Negative for rash.  Neurological: Negative for dizziness, tingling, sensory change, focal weakness and headaches.  Endo/Heme/Allergies: Negative for environmental allergies and polydipsia. Does not bruise/bleed easily.  Psychiatric/Behavioral: Negative for depression and suicidal ideas. The patient is not nervous/anxious and does not have insomnia.      Objective  Filed Vitals:    04/18/16 0832  BP: 120/80  Pulse: 80  Resp: 12  Height: 5\' 2"  (1.575 m)  Weight: 120 lb (54.432 kg)    Physical Exam  Constitutional: She is well-developed, well-nourished, and in no distress. No distress.  HENT:  Head: Normocephalic and atraumatic.  Right Ear: Tympanic membrane, external ear and ear canal normal.  Left Ear: Tympanic membrane, external ear and ear canal normal.  Nose: Nose normal.  Mouth/Throat: Uvula is midline, oropharynx is clear and moist and mucous membranes are normal. Normal dentition.  Eyes: Conjunctivae and EOM are normal. Pupils are equal, round, and reactive to light. Right eye exhibits no discharge. Left eye exhibits no discharge.  Fundoscopic exam:      The right eye shows no arteriolar narrowing and no AV nicking.       The left eye shows no arteriolar narrowing and no AV nicking.  Neck: Trachea normal and normal range of motion. Neck supple. Normal carotid pulses, no hepatojugular reflux and no JVD present. Carotid bruit is not present. No thyromegaly present.  Cardiovascular: Normal rate, regular rhythm, S1 normal, S2 normal, normal heart sounds, intact distal pulses and normal pulses.  Exam reveals no gallop and no friction rub.   No murmur heard. Pulmonary/Chest: Effort normal and breath sounds normal. Right breast exhibits no inverted nipple, no mass, no nipple discharge, no skin change and no tenderness. Left breast exhibits no inverted nipple, no mass, no nipple discharge, no skin change and no tenderness. Breasts are symmetrical.  Abdominal: Soft. Normal appearance, normal aorta and bowel sounds are normal. She exhibits no mass. There is no hepatosplenomegaly. There is no tenderness. There is no guarding.  Genitourinary: Rectum normal. Rectal exam shows no external hemorrhoid and no internal hemorrhoid.  Musculoskeletal: Normal range of motion. She exhibits no edema.  Lymphadenopathy:       Head (right side): No submental and no submandibular  adenopathy present.       Head (left side): No submental and no submandibular adenopathy present.    She has no cervical adenopathy.       Right cervical: No superficial cervical adenopathy present.      Left cervical: No superficial cervical adenopathy present.       Right axillary: No pectoral and no lateral adenopathy present.       Left axillary: No pectoral and no lateral adenopathy present. Neurological: She is alert. She has normal motor skills, normal strength, normal reflexes and intact cranial nerves.  Skin: Skin is warm, dry and intact. She is not diaphoretic.  Psychiatric: Mood and affect normal. Her mood appears not anxious. Her affect is not labile. She is not agitated. She does not exhibit a depressed mood. She is not apathetic. She does not have a flat affect.  Nursing note and vitals reviewed.     Assessment & Plan  Problem List Items Addressed This Visit    None    Visit Diagnoses    Annual physical exam    -  Primary    Relevant Orders    Basic metabolic panel    Breast cancer screening        Relevant Orders    MM Digital Screening    Colon cancer screening        Relevant Orders    POCT Occult Blood Stool (Completed)    Screening cholesterol level        Relevant Orders    Lipid Profile    Need for Tdap vaccination        Relevant Orders    Tdap vaccine greater than or equal to 7yo IM (Completed)         Dr. Mac[facility]  04/18/2016

## 2016-04-19 LAB — LIPID PANEL
CHOL/HDL RATIO: 3.3 ratio (ref 0.0–4.4)
Cholesterol, Total: 211 mg/dL — ABNORMAL HIGH (ref 100–199)
HDL: 64 mg/dL (ref >39)
LDL CALC: 131 mg/dL — AB (ref 0–99)
TRIGLYCERIDES: 80 mg/dL (ref 0–149)
VLDL Cholesterol Cal: 16 mg/dL (ref 5–40)

## 2016-04-19 LAB — BASIC METABOLIC PANEL
BUN / CREAT RATIO: 17 (ref 12–28)
BUN: 12 mg/dL (ref 8–27)
CO2: 24 mmol/L (ref 18–29)
CREATININE: 0.69 mg/dL (ref 0.57–1.00)
Calcium: 9.3 mg/dL (ref 8.7–10.3)
Chloride: 103 mmol/L (ref 96–106)
GFR calc Af Amer: 109 mL/min/{1.73_m2} (ref >59)
GFR, EST NON AFRICAN AMERICAN: 94 mL/min/{1.73_m2} (ref >59)
Glucose: 69 mg/dL (ref 65–99)
POTASSIUM: 3.7 mmol/L (ref 3.5–5.2)
SODIUM: 142 mmol/L (ref 134–144)

## 2016-05-02 ENCOUNTER — Ambulatory Visit (INDEPENDENT_AMBULATORY_CARE_PROVIDER_SITE_OTHER): Payer: BLUE CROSS/BLUE SHIELD | Admitting: Gastroenterology

## 2016-05-02 ENCOUNTER — Other Ambulatory Visit: Payer: Self-pay

## 2016-05-02 ENCOUNTER — Encounter: Payer: Self-pay | Admitting: Gastroenterology

## 2016-05-02 VITALS — BP 131/73 | HR 71 | Temp 97.7°F | Ht 62.0 in | Wt 120.0 lb

## 2016-05-02 DIAGNOSIS — B181 Chronic viral hepatitis B without delta-agent: Secondary | ICD-10-CM

## 2016-05-02 NOTE — Patient Instructions (Signed)
You are scheduled for a RUQ abdominal ultrasound at [facility] on July 11th @ 8:45am. You are to arrive at 8:15am. Please do not eat or drink after midnight tonight. If you cannot make this appt, please call Central Scheduling at [phone].

## 2016-05-02 NOTE — Progress Notes (Signed)
   Primary Care Physician: Otilio Miu, MD  Primary Gastroenterologist:  Dr. Lucilla Lame  Chief Complaint  Patient presents with  . Constipation    HPI: Tiffany Clements is a 60 y.o. 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 The patient also reports that she has not had a colonoscopy in 10 years. The patient's constipation has been helped with some [place] herbs that she has taken as well as with prune juice but she states that both of them give her cramps. The patient has not had a follow-up for her hepatitis B in some time. She does report some right upper quadrant pain. There is no report of any black stools or bloody stools. She also denies any unexplained weight loss.  Current Outpatient Prescriptions  Medication Sig Dispense Refill  . cetirizine (ZYRTEC) 10 MG tablet Take 10 mg by mouth daily.    Marland Kitchen loratadine (CLARITIN) 10 MG tablet Take 10 mg by mouth daily.    Marland Kitchen triamcinolone cream (KENALOG) 0.1 % Apply 1 application topically 2 (two) times daily. (Patient not taking: Reported on 05/02/2016) 30 g 0   No current facility-administered medications for this visit.    Allergies as of 05/02/2016  . (No Known Allergies)    ROS:  General: Negative for anorexia, weight loss, fever, chills, fatigue, weakness. ENT: Negative for hoarseness, difficulty swallowing , nasal congestion. CV: Negative for chest pain, angina, palpitations, dyspnea on exertion, peripheral edema.  Respiratory: Negative for dyspnea at rest, dyspnea on exertion, cough, sputum, wheezing.  GI: See history of present illness. GU:  Negative for dysuria, hematuria, urinary incontinence, urinary frequency, nocturnal urination.  Endo: Negative for unusual weight change.    Physical Examination:   BP 131/73 mmHg  Pulse 71  Temp(Src) 97.7 F (36.5 C) (Oral)  Ht 5\' 2"  (1.575 m)  Wt 120 lb (54.432 kg)  BMI 21.94 kg/m2  General: Well-nourished, well-developed in no acute  distress.  Eyes: No icterus. Conjunctivae pink. Mouth: Oropharyngeal mucosa moist and pink , no lesions erythema or exudate. Lungs: Clear to auscultation bilaterally. Non-labored. Heart: Regular rate and rhythm, no murmurs rubs or gallops.  Abdomen: Bowel sounds are normal, nontender, nondistended, no hepatosplenomegaly or masses, no abdominal bruits or hernia , no rebound or guarding.   Extremities: No lower extremity edema. No clubbing or deformities. Neuro: Alert and oriented x 3.  Grossly intact. Skin: Warm and dry, no jaundice.   Psych: Alert and cooperative, normal mood and affect.  Labs:    Imaging Studies: No results found.  Assessment and Plan:   Tiffany Clements is a 60 y.o. y/o female who has a history of hepatitis B and has not had a colonoscopy in 10 years. The patient will be put on Linzess for her chronic constipation. The patient will also be set up for a right upper quadrant ultrasound and a screening colonoscopy. The patient will have her labs sent off for hepatitis B viral load and hepatitis Be antibody and antigen. The patient has been explained the plan and agrees with it.   Note: This dictation was prepared with Dragon dictation along with smaller phrase technology. Any transcriptional errors that result from this process are unintentional.

## 2016-05-03 ENCOUNTER — Ambulatory Visit
Admission: RE | Admit: 2016-05-03 | Discharge: 2016-05-03 | Disposition: A | Discharge status: Home / Self Care | Payer: BLUE CROSS/BLUE SHIELD | Source: Ambulatory Visit | Attending: Gastroenterology | Admitting: Gastroenterology

## 2016-05-03 ENCOUNTER — Other Ambulatory Visit: Payer: Self-pay

## 2016-05-03 ENCOUNTER — Telehealth: Payer: Self-pay

## 2016-05-03 DIAGNOSIS — B181 Chronic viral hepatitis B without delta-agent: Secondary | ICD-10-CM | POA: Diagnosis not present

## 2016-05-03 DIAGNOSIS — R932 Abnormal findings on diagnostic imaging of liver and biliary tract: Secondary | ICD-10-CM | POA: Diagnosis not present

## 2016-05-03 NOTE — Telephone Encounter (Signed)
-----   Message from Lucilla Lame, MD sent at 05/03/2016 12:29 PM EDT ----- Let the patient know that the ultrasound showed fatty liver without any masses or abnormal lesion seen.

## 2016-05-03 NOTE — Telephone Encounter (Signed)
Tried contacting pt but vm was not set up yet.  

## 2016-05-03 NOTE — Telephone Encounter (Signed)
Pt notified of US results.  

## 2016-05-04 LAB — HEPATITIS B DNA, ULTRAQUANTITATIVE, PCR: HBV DNA SERPL PCR-ACNC: NOT DETECTED IU/mL

## 2016-05-04 LAB — HEPATITIS B E ANTIBODY: Hep B E Ab: POSITIVE — AB

## 2016-05-04 LAB — HEPATITIS B E ANTIGEN: Hep B E Ag: NEGATIVE

## 2016-05-05 ENCOUNTER — Ambulatory Visit
Admission: RE | Admit: 2016-05-05 | Discharge: 2016-05-05 | Disposition: A | Discharge status: Home / Self Care | Payer: BLUE CROSS/BLUE SHIELD | Source: Ambulatory Visit | Attending: Family Medicine | Admitting: Family Medicine

## 2016-05-05 ENCOUNTER — Telehealth: Payer: Self-pay

## 2016-05-05 ENCOUNTER — Encounter: Payer: Self-pay | Admitting: *Deleted

## 2016-05-05 DIAGNOSIS — R928 Other abnormal and inconclusive findings on diagnostic imaging of breast: Secondary | ICD-10-CM | POA: Diagnosis not present

## 2016-05-05 DIAGNOSIS — Z1231 Encounter for screening mammogram for malignant neoplasm of breast: Secondary | ICD-10-CM | POA: Diagnosis not present

## 2016-05-05 DIAGNOSIS — Z1239 Encounter for other screening for malignant neoplasm of breast: Secondary | ICD-10-CM

## 2016-05-05 NOTE — Telephone Encounter (Signed)
-----   Message from Lucilla Lame, MD sent at 05/05/2016 12:56 PM EDT ----- Let the patient know that her hepatitis B is inactive and she should follow-up in 6 months for retesting.

## 2016-05-05 NOTE — Telephone Encounter (Signed)
Pt walked into clinic today for other to discuss medication and results given during this time.

## 2016-05-06 ENCOUNTER — Other Ambulatory Visit: Payer: Self-pay | Admitting: Family Medicine

## 2016-05-06 DIAGNOSIS — N6489 Other specified disorders of breast: Secondary | ICD-10-CM

## 2016-05-06 NOTE — Discharge Instructions (Signed)

## 2016-05-09 ENCOUNTER — Encounter
Admission: RE | Disposition: A | Discharge status: Home / Self Care | Payer: Self-pay | Source: Ambulatory Visit | Attending: Gastroenterology

## 2016-05-09 ENCOUNTER — Ambulatory Visit: Payer: BLUE CROSS/BLUE SHIELD | Admitting: Anesthesiology

## 2016-05-09 ENCOUNTER — Ambulatory Visit
Admission: RE | Admit: 2016-05-09 | Discharge: 2016-05-09 | Disposition: A | Discharge status: Home / Self Care | Payer: BLUE CROSS/BLUE SHIELD | Source: Ambulatory Visit | Attending: Gastroenterology | Admitting: Gastroenterology

## 2016-05-09 DIAGNOSIS — D124 Benign neoplasm of descending colon: Secondary | ICD-10-CM | POA: Diagnosis not present

## 2016-05-09 DIAGNOSIS — K640 First degree hemorrhoids: Secondary | ICD-10-CM | POA: Diagnosis not present

## 2016-05-09 DIAGNOSIS — Z9109 Other allergy status, other than to drugs and biological substances: Secondary | ICD-10-CM | POA: Insufficient documentation

## 2016-05-09 DIAGNOSIS — Z803 Family history of malignant neoplasm of breast: Secondary | ICD-10-CM | POA: Insufficient documentation

## 2016-05-09 DIAGNOSIS — B181 Chronic viral hepatitis B without delta-agent: Secondary | ICD-10-CM | POA: Insufficient documentation

## 2016-05-09 DIAGNOSIS — Z1211 Encounter for screening for malignant neoplasm of colon: Secondary | ICD-10-CM | POA: Diagnosis not present

## 2016-05-09 DIAGNOSIS — Z79899 Other long term (current) drug therapy: Secondary | ICD-10-CM | POA: Insufficient documentation

## 2016-05-09 DIAGNOSIS — D120 Benign neoplasm of cecum: Secondary | ICD-10-CM | POA: Insufficient documentation

## 2016-05-09 DIAGNOSIS — D125 Benign neoplasm of sigmoid colon: Secondary | ICD-10-CM | POA: Diagnosis not present

## 2016-05-09 DIAGNOSIS — Z9889 Other specified postprocedural states: Secondary | ICD-10-CM | POA: Insufficient documentation

## 2016-05-09 HISTORY — PX: POLYPECTOMY: SHX5525

## 2016-05-09 HISTORY — PX: COLONOSCOPY WITH PROPOFOL: SHX5780

## 2016-05-09 HISTORY — DX: Chronic viral hepatitis B without delta-agent: B18.1

## 2016-05-09 SURGERY — COLONOSCOPY WITH PROPOFOL
Anesthesia: Monitor Anesthesia Care | Wound class: Contaminated

## 2016-05-09 MED ORDER — LIDOCAINE HCL (CARDIAC) 20 MG/ML IV SOLN
INTRAVENOUS | Status: DC | PRN
  Administered 2016-05-09: 50 mg via INTRAVENOUS

## 2016-05-09 MED ORDER — STERILE WATER FOR IRRIGATION IR SOLN
Status: DC | PRN
  Administered 2016-05-09: 12:00:00

## 2016-05-09 MED ORDER — PROPOFOL 10 MG/ML IV BOLUS
INTRAVENOUS | Status: DC | PRN
  Administered 2016-05-09 (×6): 20 mg via INTRAVENOUS
  Administered 2016-05-09: 60 mg via INTRAVENOUS
  Administered 2016-05-09 (×2): 30 mg via INTRAVENOUS

## 2016-05-09 MED ORDER — LACTATED RINGERS IV SOLN
INTRAVENOUS | Status: DC
  Administered 2016-05-09: 11:00:00 via INTRAVENOUS

## 2016-05-09 SURGICAL SUPPLY — 23 items
CANISTER SUCT 1200ML W/VALVE (MISCELLANEOUS) ×4 IMPLANT
CLIP HMST 235XBRD CATH ROT (MISCELLANEOUS) IMPLANT
CLIP RESOLUTION 360 11X235 (MISCELLANEOUS)
FCP ESCP3.2XJMB 240X2.8X (MISCELLANEOUS)
FORCEPS BIOP RAD 4 LRG CAP 4 (CUTTING FORCEPS) ×4 IMPLANT
FORCEPS BIOP RJ4 240 W/NDL (MISCELLANEOUS)
FORCEPS ESCP3.2XJMB 240X2.8X (MISCELLANEOUS) IMPLANT
GOWN CVR UNV OPN BCK APRN NK (MISCELLANEOUS) ×4 IMPLANT
GOWN ISOL THUMB LOOP REG UNIV (MISCELLANEOUS) ×4
INJECTOR VARIJECT VIN23 (MISCELLANEOUS) IMPLANT
KIT DEFENDO VALVE AND CONN (KITS) IMPLANT
KIT ENDO PROCEDURE OLY (KITS) ×4 IMPLANT
MARKER SPOT ENDO TATTOO 5ML (MISCELLANEOUS) IMPLANT
PAD GROUND ADULT SPLIT (MISCELLANEOUS) IMPLANT
PROBE APC STR FIRE (PROBE) IMPLANT
RETRIEVER NET ROTH 2.5X230 LF (MISCELLANEOUS) ×4 IMPLANT
SNARE SHORT THROW 13M SML OVAL (MISCELLANEOUS) IMPLANT
SNARE SHORT THROW 30M LRG OVAL (MISCELLANEOUS) IMPLANT
SNARE SNG USE RND 15MM (INSTRUMENTS) IMPLANT
SPOT EX ENDOSCOPIC TATTOO (MISCELLANEOUS)
TRAP ETRAP POLY (MISCELLANEOUS) IMPLANT
VARIJECT INJECTOR VIN23 (MISCELLANEOUS)
WATER STERILE IRR 250ML POUR (IV SOLUTION) ×4 IMPLANT

## 2016-05-09 NOTE — H&P (Signed)
  Lucilla Lame, MD [facility] [address] [place], [place] [address] Phone: [phone] Fax : [phone]  Primary Care Physician:  Otilio Miu, MD Primary Gastroenterologist:  Dr. Allen Norris  Pre-Procedure History & Physical: HPI:  Tiffany Clements is a 60 y.o. female is here for a screening colonoscopy.   Past Medical History  Diagnosis Date  . Allergy   . Hepatitis B carrier     Past Surgical History  Procedure Laterality Date  . Breast surgery      fibroid tumor excision  . Breast biopsy Left     negx2  . Breast biopsy Right     neg    Prior to Admission medications   Medication Sig Start Date End Date Taking? Authorizing Provider  cetirizine (ZYRTEC) 10 MG tablet Take 10 mg by mouth daily.   Yes Historical Provider, MD  Linaclotide (LINZESS PO) Take by mouth daily.   Yes Historical Provider, MD  loratadine (CLARITIN) 10 MG tablet Take 10 mg by mouth daily. Reported on 05/05/2016    Historical Provider, MD  triamcinolone cream (KENALOG) 0.1 % Apply 1 application topically 2 (two) times daily. Patient not taking: Reported on 05/02/2016 04/01/16   Juline Patch, MD    Allergies as of 05/03/2016  . (No Known Allergies)    Family History  Problem Relation Age of Onset  . Breast cancer Sister 17    Social History   Social History  . Marital Status: Single    Spouse Name: N/A  . Number of Children: N/A  . Years of Education: N/A   Occupational History  . Not on file.   Social History Main Topics  . Smoking status: Never Smoker   . Smokeless tobacco: Never Used  . Alcohol Use: No  . Drug Use: No  . Sexual Activity: No   Other Topics Concern  . Not on file   Social History Narrative    Review of Systems: See HPI, otherwise negative ROS  Physical Exam: BP 156/82 mmHg  Pulse 106  Temp(Src) 98 F (36.7 C) (Temporal)  Resp 16  Ht 5\' 2"  (1.575 m)  Wt 116 lb (52.617 kg)  BMI 21.21 kg/m2  SpO2 98% General:   Alert,  pleasant and cooperative in  NAD Head:  Normocephalic and atraumatic. Neck:  Supple; no masses or thyromegaly. Lungs:  Clear throughout to auscultation.    Heart:  Regular rate and rhythm. Abdomen:  Soft, nontender and nondistended. Normal bowel sounds, without guarding, and without rebound.   Neurologic:  Alert and  oriented x4;  grossly normal neurologically.  Impression/Plan: Tiffany Clements is now here to undergo a screening colonoscopy.  Risks, benefits, and alternatives regarding colonoscopy have been reviewed with the patient.  Questions have been answered.  All parties agreeable.

## 2016-05-09 NOTE — Anesthesia Procedure Notes (Signed)
Procedure Name: MAC Performed by: Brystol Wasilewski Pre-anesthesia Checklist: Patient identified, Emergency Drugs available, Suction available, Timeout performed and Patient being monitored Patient Re-evaluated:Patient Re-evaluated prior to inductionOxygen Delivery Method: Nasal cannula Placement Confirmation: positive ETCO2       

## 2016-05-09 NOTE — Anesthesia Preprocedure Evaluation (Addendum)
Anesthesia Evaluation  Patient identified by MRN, date of birth, ID band Patient awake    Airway Mallampati: I  TM Distance: >3 FB Neck ROM: Full    Dental   Pulmonary    Pulmonary exam normal        Cardiovascular Normal cardiovascular exam     Neuro/Psych    GI/Hepatic (+) Hepatitis -, BHep B - inactive, signs of fatty liver on recent US   Endo/Other    Renal/GU      Musculoskeletal   Abdominal   Peds  Hematology   Anesthesia Other Findings   Reproductive/Obstetrics                            Anesthesia Physical Anesthesia Plan  ASA: II  Anesthesia Plan: MAC   Post-op Pain Management:    Induction:   Airway Management Planned:   Additional Equipment:   Intra-op Plan:   Post-operative Plan:   Informed Consent: I have reviewed the patients History and Physical, chart, labs and discussed the procedure including the risks, benefits and alternatives for the proposed anesthesia with the patient or authorized representative who has indicated his/her understanding and acceptance.     Plan Discussed with: CRNA  Anesthesia Plan Comments:         Anesthesia Quick Evaluation

## 2016-05-09 NOTE — Anesthesia Postprocedure Evaluation (Signed)
Anesthesia Post Note  Patient: Tiffany Clements  Procedure(s) Performed: Procedure(s) (LRB): COLONOSCOPY WITH PROPOFOL (N/A) POLYPECTOMY  Patient location during evaluation: PACU Anesthesia Type: General Level of consciousness: awake and alert Pain management: pain level controlled Vital Signs Assessment: post-procedure vital signs reviewed and stable Respiratory status: spontaneous breathing, nonlabored ventilation, respiratory function stable and patient connected to nasal cannula oxygen Cardiovascular status: blood pressure returned to baseline and stable Postop Assessment: no signs of nausea or vomiting Anesthetic complications: no    Marshell Levan

## 2016-05-09 NOTE — Transfer of Care (Signed)
Immediate Anesthesia Transfer of Care Note  Patient: Tiffany Clements  Procedure(s) Performed: Procedure(s): COLONOSCOPY WITH PROPOFOL (N/A) POLYPECTOMY  Patient Location: PACU  Anesthesia Type: MAC  Level of Consciousness: awake, alert  and patient cooperative  Airway and Oxygen Therapy: Patient Spontanous Breathing and Patient connected to supplemental oxygen  Post-op Assessment: Post-op Vital signs reviewed, Patient's Cardiovascular Status Stable, Respiratory Function Stable, Patent Airway and No signs of Nausea or vomiting  Post-op Vital Signs: Reviewed and stable  Complications: No apparent anesthesia complications

## 2016-05-09 NOTE — Op Note (Signed)
[facility] Patient Name: Tiffany Clements Procedure Date: 05/09/2016 11:25 AM MRN: [id number] Account #: [id number] Date of Birth: [birth year] Admit Type: Outpatient Age: 60 Room: [facility] OR ROOM 01 Gender: Female Note Status: Finalized Procedure:            Colonoscopy Indications:          Screening for colorectal malignant neoplasm Providers:            Lucilla Lame MD, MD Referring MD:         Juline Patch, MD (Referring MD) Medicines:            Propofol per Anesthesia Complications:        No immediate complications. Procedure:            Pre-Anesthesia Assessment:                       - Prior to the procedure, a History and Physical was                        performed, and patient medications and allergies were                        reviewed. The patient's tolerance of previous                        anesthesia was also reviewed. The risks and benefits of                        the procedure and the sedation options and risks were                        discussed with the patient. All questions were                        answered, and informed consent was obtained. Prior                        Anticoagulants: The patient has taken no previous                        anticoagulant or antiplatelet agents. ASA Grade                        Assessment: II - A patient with mild systemic disease.                        After reviewing the risks and benefits, the patient was                        deemed in satisfactory condition to undergo the                        procedure.                       After obtaining informed consent, the colonoscope was                        passed under direct vision. Throughout the procedure,  the patient's blood pressure, pulse, and oxygen                        saturations were monitored continuously. The Olympus                        CF-HQ190L Colonoscope (S#. [id number]) was introduced                     through the anus and advanced to the the cecum,                        identified by appendiceal orifice and ileocecal valve.                        The colonoscopy was performed without difficulty. The                        patient tolerated the procedure well. The quality of                        the bowel preparation was good. Findings:      The perianal and digital rectal examinations were normal.      Two sessile polyps were found in the cecum. The polyps were 2 to 4 mm in       size. These polyps were removed with a cold biopsy forceps. Resection       and retrieval were complete.      A 3 mm polyp was found in the descending colon. The polyp was sessile.       The polyp was removed with a cold biopsy forceps. Resection and       retrieval were complete.      Non-bleeding internal hemorrhoids were found during retroflexion. The       hemorrhoids were Grade I (internal hemorrhoids that do not prolapse). Impression:           - Two 2 to 4 mm polyps in the cecum, removed with a                        cold biopsy forceps. Resected and retrieved.                       - One 3 mm polyp in the descending colon, removed with                        a cold biopsy forceps. Resected and retrieved.                       - Non-bleeding internal hemorrhoids. Recommendation:       - Repeat colonoscopy in 5 years if polyp adenoma and 10                        years if hyperplastic Procedure Code(s):    --- Professional ---                       [phone], Colonoscopy, flexible; with biopsy, single or                        multiple Diagnosis Code(s):    ---  Professional ---                       Z12.11, Encounter for screening for malignant neoplasm                        of colon                       D12.0, Benign neoplasm of cecum                       D12.4, Benign neoplasm of descending colon CPT copyright 2016 American Medical Association. All rights reserved. The codes  documented in this report are preliminary and upon coder review may  be revised to meet current compliance requirements. Lucilla Lame MD, MD 05/09/2016 11:51:13 AM This report has been signed electronically. Number of Addenda: 0 Note Initiated On: 05/09/2016 11:25 AM Scope Withdrawal Time: 0 hours 6 minutes 36 seconds  Total Procedure Duration: 0 hours 11 minutes 13 seconds       [facility]

## 2016-05-10 ENCOUNTER — Encounter: Payer: Self-pay | Admitting: Gastroenterology

## 2016-05-11 ENCOUNTER — Telehealth: Payer: Self-pay

## 2016-05-11 ENCOUNTER — Encounter: Payer: Self-pay | Admitting: Gastroenterology

## 2016-05-11 NOTE — Telephone Encounter (Signed)
Spoke with pt's daughter regarding her mothers pain. Advised her this could possibly be gas pain from where air is used to inflate the bowel during her colonoscopy and she may have gas that is stuck. Advised her to walk around and try otc GasX and probiotics to help put good bacteria back into her bowel. Advised her to contact me by Friday if pain continues. If pain worsens, she develops rectal bleeding or fever, she was directed to take her directly to the ER. Daughter verbalized understanding.

## 2016-05-11 NOTE — Telephone Encounter (Signed)
Tiffany Clements (patients daughter) Tiffany Clements because her mom(Tiffany Clements) is having pain on her right side where the polyps were removed. Patient had a colonoscopy on 05/09/16. I transferred the call to a nurse.

## 2016-05-16 ENCOUNTER — Other Ambulatory Visit: Payer: Self-pay

## 2016-05-16 DIAGNOSIS — K5909 Other constipation: Secondary | ICD-10-CM

## 2016-05-16 MED ORDER — LINACLOTIDE 145 MCG PO CAPS: 145.0000 ug | ORAL_CAPSULE | Freq: Every day | ORAL | 6 refills | Status: AC

## 2016-05-16 NOTE — Telephone Encounter (Signed)
Let the patient know that the polyps were taken off with a cold biopsy and she should not be able to feel the biopsy sites. It is more likely that she has some retained gas as the cause of her pain.

## 2016-05-16 NOTE — Telephone Encounter (Signed)
Pt called today stating she is still having abdominal pain in the area where the polyps were removed. She denies nausea, vomiting or rectal bleeding. She has requested a refill on her Linzess 136mcg which I have sent to her pharmacy. Please advise.

## 2016-05-17 ENCOUNTER — Ambulatory Visit
Admission: RE | Admit: 2016-05-17 | Discharge: 2016-05-17 | Disposition: A | Discharge status: Home / Self Care | Payer: BLUE CROSS/BLUE SHIELD | Source: Ambulatory Visit | Attending: Family Medicine | Admitting: Family Medicine

## 2016-05-17 DIAGNOSIS — N6489 Other specified disorders of breast: Secondary | ICD-10-CM | POA: Diagnosis present

## 2016-05-17 DIAGNOSIS — N63 Unspecified lump in breast: Secondary | ICD-10-CM | POA: Diagnosis not present

## 2016-05-17 NOTE — Telephone Encounter (Signed)
Pt notified. Pt advised if she starts having severe pain, nausea, vomiting, fever or rectal bleeding to give me a call.

## 2016-05-27 ENCOUNTER — Encounter: Payer: Self-pay | Admitting: Family Medicine

## 2016-05-27 ENCOUNTER — Ambulatory Visit (INDEPENDENT_AMBULATORY_CARE_PROVIDER_SITE_OTHER): Payer: BLUE CROSS/BLUE SHIELD | Admitting: Family Medicine

## 2016-05-27 VITALS — BP 110/80 | HR 64 | Temp 97.4°F | Ht 62.0 in | Wt 118.0 lb

## 2016-05-27 DIAGNOSIS — J0100 Acute maxillary sinusitis, unspecified: Secondary | ICD-10-CM

## 2016-05-27 MED ORDER — AMOXICILLIN-POT CLAVULANATE 875-125 MG PO TABS: 1.0000 | ORAL_TABLET | Freq: Two times a day (BID) | ORAL | 0 refills | Status: DC

## 2016-05-27 NOTE — Progress Notes (Signed)
Name: Tiffany Clements   MRN: [id number]    DOB: 1956-06-28   Date:05/27/2016       Progress Note  Subjective  Chief Complaint  Chief Complaint  Patient presents with  . Dizziness    x 2-3 days- feeling dizzy, nauseated and chills    Dizziness  This is a chronic problem. The current episode started more than 1 year ago. The problem occurs daily. The problem has been waxing and waning. Associated symptoms include chills, congestion, fatigue and headaches. Pertinent negatives include no abdominal pain, arthralgias, coughing, fever, joint swelling, myalgias, nausea, neck pain, rash, sore throat, swollen glands, urinary symptoms, vertigo, visual change or vomiting. Nothing aggravates the symptoms. She has tried nothing for the symptoms. The treatment provided no relief.    No problem-specific Assessment & Plan notes found for this encounter.   Past Medical History:  Diagnosis Date  . Allergy   . Hepatitis B carrier     Past Surgical History:  Procedure Laterality Date  . BREAST BIOPSY Left    negx2  . BREAST BIOPSY Right    neg  . BREAST SURGERY     fibroid tumor excision  . COLONOSCOPY WITH PROPOFOL N/A 05/09/2016   Procedure: COLONOSCOPY WITH PROPOFOL;  Surgeon: Lucilla Lame, MD;  Location: [place];  Service: Endoscopy;  Laterality: N/A;  . POLYPECTOMY  05/09/2016   Procedure: POLYPECTOMY;  Surgeon: Lucilla Lame, MD;  Location: [place];  Service: Endoscopy;;    Family History  Problem Relation Age of Onset  . Breast cancer Sister 69    Social History   Social History  . Marital status: Single    Spouse name: N/A  . Number of children: N/A  . Years of education: N/A   Occupational History  . Not on file.   Social History Main Topics  . Smoking status: Never Smoker  . Smokeless tobacco: Never Used  . Alcohol use No  . Drug use: No  . Sexual activity: No   Other Topics Concern  . Not on file   Social History Narrative  . No narrative on file     No Known Allergies   Review of Systems  Constitutional: Positive for chills and fatigue. Negative for fever.  HENT: Positive for congestion. Negative for sore throat.   Respiratory: Negative for cough.   Gastrointestinal: Negative for abdominal pain, nausea and vomiting.  Musculoskeletal: Negative for arthralgias, joint swelling, myalgias and neck pain.  Skin: Negative for rash.  Neurological: Positive for dizziness and headaches. Negative for vertigo.     Objective  Vitals:   05/27/16 1031  BP: 110/80  Pulse: 64  Temp: 97.4 F (36.3 C)  TempSrc: Oral  Weight: 118 lb (53.5 kg)  Height: 5\' 2"  (1.575 m)    Physical Exam  Constitutional: She is well-developed, well-nourished, and in no distress. No distress.  HENT:  Head: Normocephalic and atraumatic.  Right Ear: External ear normal.  Left Ear: External ear normal.  Nose: Nose normal.  Mouth/Throat: Oropharynx is clear and moist.  Eyes: Conjunctivae and EOM are normal. Pupils are equal, round, and reactive to light. Right eye exhibits no discharge. Left eye exhibits no discharge.  Neck: Normal range of motion. Neck supple. No JVD present. No thyromegaly present.  Cardiovascular: Normal rate, regular rhythm, normal heart sounds and intact distal pulses.  Exam reveals no gallop and no friction rub.   No murmur heard. Pulmonary/Chest: Effort normal and breath sounds normal. She has no wheezes. She  has no rales.  Abdominal: Soft. Bowel sounds are normal. She exhibits no mass. There is no tenderness. There is no guarding.  Musculoskeletal: Normal range of motion. She exhibits no edema.  Lymphadenopathy:    She has no cervical adenopathy.  Neurological: She is alert. She has normal reflexes.  Skin: Skin is warm and dry. She is not diaphoretic.  Psychiatric: Mood and affect normal.  Nursing note and vitals reviewed.     Assessment & Plan  Problem List Items Addressed This Visit    None    Visit Diagnoses    Acute  maxillary sinusitis, recurrence not specified    -  Primary   Relevant Medications   amoxicillin-clavulanate (AUGMENTIN) 875-125 MG tablet        Dr. Mac[facility]  05/27/16

## 2016-11-17 ENCOUNTER — Encounter: Payer: Self-pay | Admitting: Family Medicine

## 2016-11-17 ENCOUNTER — Ambulatory Visit
Admission: RE | Admit: 2016-11-17 | Discharge: 2016-11-17 | Disposition: A | Discharge status: Home / Self Care | Payer: Managed Care, Other (non HMO) | Source: Ambulatory Visit | Attending: Family Medicine | Admitting: Family Medicine

## 2016-11-17 ENCOUNTER — Ambulatory Visit (INDEPENDENT_AMBULATORY_CARE_PROVIDER_SITE_OTHER): Payer: Managed Care, Other (non HMO) | Admitting: Family Medicine

## 2016-11-17 VITALS — BP 138/88 | HR 72 | Ht 62.0 in | Wt 125.0 lb

## 2016-11-17 DIAGNOSIS — M5441 Lumbago with sciatica, right side: Secondary | ICD-10-CM

## 2016-11-17 DIAGNOSIS — M461 Sacroiliitis, not elsewhere classified: Secondary | ICD-10-CM | POA: Diagnosis not present

## 2016-11-17 DIAGNOSIS — M47816 Spondylosis without myelopathy or radiculopathy, lumbar region: Secondary | ICD-10-CM | POA: Diagnosis not present

## 2016-11-17 DIAGNOSIS — M5442 Lumbago with sciatica, left side: Secondary | ICD-10-CM

## 2016-11-17 MED ORDER — ETODOLAC 400 MG PO TABS: 400.0000 mg | ORAL_TABLET | Freq: Two times a day (BID) | ORAL | 1 refills | Status: AC

## 2016-11-17 MED ORDER — PREDNISONE 10 MG PO TABS: 10.0000 mg | ORAL_TABLET | Freq: Every day | ORAL | 0 refills | Status: AC

## 2016-11-17 NOTE — Progress Notes (Signed)
Name: Tiffany Clements   MRN: [id number]    DOB: [birth year]   Date:11/17/2016       Progress Note  Subjective  Chief Complaint  Chief Complaint  Patient presents with  . Back Pain    lower back pain radiating down both legs    Back Pain  This is a new problem. The current episode started in the past 7 days. The problem occurs constantly. The problem is unchanged. The pain is present in the lumbar spine and sacro-iliac. The quality of the pain is described as aching. The pain radiates to the left thigh and right thigh. The pain is at a severity of 8/10. The pain is moderate. The symptoms are aggravated by bending. Pertinent negatives include no abdominal pain, bladder incontinence, bowel incontinence, chest pain, dysuria, fever, headaches, leg pain, numbness, paresis, paresthesias, pelvic pain, perianal numbness, tingling, weakness or weight loss. She has tried NSAIDs (ibuprofen) for the symptoms. The treatment provided mild relief.    No problem-specific Assessment & Plan notes found for this encounter.   Past Medical History:  Diagnosis Date  . Allergy   . Hepatitis B carrier [facility])     Past Surgical History:  Procedure Laterality Date  . BREAST BIOPSY Left    negx2  . BREAST BIOPSY Right    neg  . BREAST SURGERY     fibroid tumor excision  . COLONOSCOPY WITH PROPOFOL N/A 05/09/2016   Procedure: COLONOSCOPY WITH PROPOFOL;  Surgeon: Lucilla Lame, MD;  Location: [place];  Service: Endoscopy;  Laterality: N/A;  . POLYPECTOMY  05/09/2016   Procedure: POLYPECTOMY;  Surgeon: Lucilla Lame, MD;  Location: [place];  Service: Endoscopy;;    Family History  Problem Relation Age of Onset  . Breast cancer Sister 30    Social History   Social History  . Marital status: Single    Spouse name: N/A  . Number of children: N/A  . Years of education: N/A   Occupational History  . Not on file.   Social History Main Topics  . Smoking status: Never Smoker  . Smokeless  tobacco: Never Used  . Alcohol use No  . Drug use: No  . Sexual activity: No   Other Topics Concern  . Not on file   Social History Narrative  . No narrative on file    No Known Allergies   Review of Systems  Constitutional: Negative for chills, fever, malaise/fatigue and weight loss.  HENT: Negative for ear discharge, ear pain and sore throat.   Eyes: Negative for blurred vision.  Respiratory: Negative for cough, sputum production, shortness of breath and wheezing.   Cardiovascular: Negative for chest pain, palpitations and leg swelling.  Gastrointestinal: Negative for abdominal pain, blood in stool, bowel incontinence, constipation, diarrhea, heartburn, melena and nausea.  Genitourinary: Negative for bladder incontinence, dysuria, frequency, hematuria, pelvic pain and urgency.  Musculoskeletal: Positive for back pain. Negative for falls, joint pain, myalgias and neck pain.  Skin: Negative for rash.  Neurological: Negative for dizziness, tingling, sensory change, focal weakness, weakness, numbness, headaches and paresthesias.  Endo/Heme/Allergies: Negative for environmental allergies and polydipsia. Does not bruise/bleed easily.  Psychiatric/Behavioral: Negative for depression and suicidal ideas. The patient is not nervous/anxious and does not have insomnia.      Objective  Vitals:   11/17/16 0928  BP: 138/88  Pulse: 72  Weight: 125 lb (56.7 kg)  Height: 5\' 2"  (1.575 m)    Physical Exam  Constitutional: She is well-developed, well-nourished,  and in no distress. No distress.  HENT:  Head: Normocephalic and atraumatic.  Right Ear: External ear normal.  Left Ear: External ear normal.  Nose: Nose normal.  Mouth/Throat: Oropharynx is clear and moist.  Eyes: Conjunctivae and EOM are normal. Pupils are equal, round, and reactive to light. Right eye exhibits no discharge. Left eye exhibits no discharge.  Neck: Normal range of motion. Neck supple. No JVD present. No  thyromegaly present.  Cardiovascular: Normal rate, regular rhythm, normal heart sounds and intact distal pulses.  Exam reveals no gallop and no friction rub.   No murmur heard. Pulmonary/Chest: Effort normal and breath sounds normal.  Abdominal: Soft. Bowel sounds are normal. She exhibits no mass. There is no tenderness. There is no guarding.  Musculoskeletal: Normal range of motion. She exhibits no edema.       Lumbar back: She exhibits spasm.  Tender bilateral SI joints  Lymphadenopathy:    She has no cervical adenopathy.  Neurological: She is alert. She has normal sensation, normal strength and normal reflexes. She has an abnormal Straight Leg Raise Test.  Left SLR  Skin: Skin is warm and dry. She is not diaphoretic.  Psychiatric: Mood and affect normal.  Nursing note and vitals reviewed.     Assessment & Plan  Problem List Items Addressed This Visit    None    Visit Diagnoses    Acute bilateral low back pain with bilateral sciatica    -  Primary   Relevant Medications   etodolac (LODINE) 400 MG tablet   predniSONE (DELTASONE) 10 MG tablet   Other Relevant Orders   DG Lumbar Spine Complete   Bilateral sacroiliitis (HCC)       Relevant Medications   etodolac (LODINE) 400 MG tablet   predniSONE (DELTASONE) 10 MG tablet        Dr. Deanna [facility]  11/17/16

## 2016-11-17 NOTE — Patient Instructions (Signed)
Low Back Pain: Brief Version   What is low back pain? How does it happen?  Low back pain is pain or stiffness in the lower back. Most of the time, it is caused when a muscle in your back is strained. For example, it can happen when you lift a heavy object or when you sit or stand for a long time. Health problems, such as arthritis, can also cause back pain. Low back pain may last a day or two, several weeks, or longer. You may have pain in one spot or it may spread down the buttocks and into your legs. You should see your health care provider right away if you have back pain with these symptoms:   You cannot control your bladder or bowels.  You have a hard time moving your legs or walking.  Your arms or legs are numb or tingling.  These symptoms may mean you have hurt your spine and nerves. When you see your health care provider, he or she will:   Ask about your symptoms.  Give you an exam. X-rays or other tests may also be done.  How is it treated?  Here are some good ideas for taking care of low back pain:   Use an electric heating pad on a low setting (or a hot water bottle wrapped in a towel) for 20 to 30 minutes. (Don't let the heating pad get too hot, and don't fall asleep with it. You could get a burn.)  Use an ice pack wrapped in a towel for 20 minutes, one to four times a day. (Don't leave it on too long. You could get frostbite. Set an alarm to remind you.)  Take aspirin, ibuprofen, or other pain medicines your health care provider may suggest.  Ask about exercises you can do to stretch and strengthen your back muscles.  When you sleep or lie down, keep these hints in mind:   Rest on a firm mattress. It may help to lie on your back with your knees raised or lie on your side with your knees bent.  Put a pillow under your knees when you are lying down.  Sleep without a pillow under your head. Talk to your health care provider about whether it would help to:  Wear a  belt or corset to support your back.  Make visits to a physical therapist for traction.  Have your back massaged by a trained person. Take it easy at first. As you start to feel better, you'll be able to do more and more. But be careful. You may need to cut back on what you do:  If your symptoms come back.  If you have more pain after you start doing more or something new.  See your health care provider if your pain is worse even with treatment. How can I take care of myself?  You can lower the strain on your back. Here are some ideas that can help:   Try to get to and keep a healthy weight.  Use good posture. Stand with your head up, shoulders straight, chest forward, your weight on both feet, and your pelvis tucked in.  Sit in a straight-backed chair and hold your spine against the back of the chair.  Sit close to the pedals when you drive. Use your seat belt and a hard backrest or pillow.  Use a footrest for one foot when you stand or sit in one spot for a long time. This keeps your back straight.  Bend your knees when you bend over.  Here are tips when you need to lift or move heavy objects:   Don't push with your arms when you move a heavy object. Turn around and push backwards so your legs take the strain.  Bend your knees and hips and keep your back straight when you lift a heavy object.  Don't lift heavy objects higher than your waist.  Hold packages you carry close to your body, with your arms bent. To rest your back, do these exercises for 5 minutes or longer:  Lie on your back, bend your knees, and put pillows under your knees.  Lie on your back, put a pillow under your neck, bend your knees to a 90- degree angle, and put your lower legs and feet on a chair.  Lie on your back, bend your knees, and bring one knee up to your chest and hold it there. Repeat with the other knee, then bring both knees to your chest. When holding your knee to your chest, grab your  thigh rather than your lower leg.

## 2016-12-30 ENCOUNTER — Encounter: Payer: Self-pay | Admitting: Family Medicine

## 2016-12-30 ENCOUNTER — Ambulatory Visit (INDEPENDENT_AMBULATORY_CARE_PROVIDER_SITE_OTHER): Payer: Managed Care, Other (non HMO) | Admitting: Family Medicine

## 2016-12-30 VITALS — BP 122/82 | HR 72 | Ht 62.0 in | Wt 128.4 lb

## 2016-12-30 DIAGNOSIS — K760 Fatty (change of) liver, not elsewhere classified: Secondary | ICD-10-CM | POA: Diagnosis not present

## 2016-12-30 DIAGNOSIS — L603 Nail dystrophy: Secondary | ICD-10-CM

## 2016-12-30 DIAGNOSIS — R1011 Right upper quadrant pain: Secondary | ICD-10-CM | POA: Diagnosis not present

## 2016-12-30 MED ORDER — RANITIDINE HCL 150 MG PO CAPS: 150.0000 mg | ORAL_CAPSULE | Freq: Two times a day (BID) | ORAL | 1 refills | Status: AC

## 2016-12-30 NOTE — Progress Notes (Signed)
Name: Tiffany Clements   MRN: [id number]    DOB: 11/13/1955   Date:12/30/2016       Progress Note  Subjective  Chief Complaint  Chief Complaint  Patient presents with  . Ingrown Toenail    Recurrent problem. It's painful, and swollen. Hurts when walking and wearing shoes.  . Abdominal Pain    more on right side of stomach, in one generalized location. Started yesterday.     Abdominal Pain  This is a recurrent problem. The current episode started yesterday. The onset quality is sudden. The problem occurs daily. The problem has been gradually worsening. The pain is located in the RUQ. The pain is at a severity of 4/10. The pain is moderate. The quality of the pain is aching. The abdominal pain does not radiate. Pertinent negatives include no constipation, diarrhea, dysuria, fever, frequency, headaches, hematochezia, hematuria, melena, myalgias, nausea, vomiting or weight loss. Nothing aggravates the pain. The pain is relieved by being still and recumbency. She has tried nothing for the symptoms. The treatment provided mild relief. Prior diagnostic workup includes lower endoscopy and ultrasound. There is no history of abdominal surgery, colon cancer or pancreatitis.    No problem-specific Assessment & Plan notes found for this encounter.   Past Medical History:  Diagnosis Date  . Allergy   . Hepatitis B carrier [facility])     Past Surgical History:  Procedure Laterality Date  . BREAST BIOPSY Left    negx2  . BREAST BIOPSY Right    neg  . BREAST SURGERY     fibroid tumor excision  . COLONOSCOPY WITH PROPOFOL N/A 05/09/2016   Procedure: COLONOSCOPY WITH PROPOFOL;  Surgeon: Lucilla Lame, MD;  Location: [place];  Service: Endoscopy;  Laterality: N/A;  . POLYPECTOMY  05/09/2016   Procedure: POLYPECTOMY;  Surgeon: Lucilla Lame, MD;  Location: [place];  Service: Endoscopy;;    Family History  Problem Relation Age of Onset  . Breast cancer Sister 19    Social History    Social History  . Marital status: Single    Spouse name: N/A  . Number of children: N/A  . Years of education: N/A   Occupational History  . Not on file.   Social History Main Topics  . Smoking status: Never Smoker  . Smokeless tobacco: Never Used  . Alcohol use No  . Drug use: No  . Sexual activity: No   Other Topics Concern  . Not on file   Social History Narrative  . No narrative on file    No Known Allergies  Outpatient Medications Prior to Visit  Medication Sig Dispense Refill  . cetirizine (ZYRTEC) 10 MG tablet Take 10 mg by mouth daily.    Marland Kitchen etodolac (LODINE) 400 MG tablet Take 1 tablet (400 mg total) by mouth 2 (two) times daily. (Patient not taking: Reported on 12/30/2016) 60 tablet 1  . linaclotide (LINZESS) 145 MCG CAPS capsule Take 1 capsule (145 mcg total) by mouth daily. (Patient not taking: Reported on 12/30/2016) 30 capsule 6  . loratadine (CLARITIN) 10 MG tablet Take 10 mg by mouth daily. Reported on 05/05/2016    . predniSONE (DELTASONE) 10 MG tablet Take 1 tablet (10 mg total) by mouth daily with breakfast. (Patient not taking: Reported on 12/30/2016) 30 tablet 0   No facility-administered medications prior to visit.     Review of Systems  Constitutional: Negative for chills, fever, malaise/fatigue and weight loss.  HENT: Negative for ear discharge, ear pain  and sore throat.   Eyes: Negative for blurred vision.  Respiratory: Negative for cough, sputum production, shortness of breath and wheezing.   Cardiovascular: Negative for chest pain, palpitations and leg swelling.  Gastrointestinal: Positive for abdominal pain. Negative for blood in stool, constipation, diarrhea, heartburn, hematochezia, melena, nausea and vomiting.  Genitourinary: Negative for dysuria, frequency, hematuria and urgency.  Musculoskeletal: Negative for back pain, joint pain, myalgias and neck pain.  Skin: Negative for rash.  Neurological: Negative for dizziness, tingling, sensory  change, focal weakness and headaches.  Endo/Heme/Allergies: Negative for environmental allergies and polydipsia. Does not bruise/bleed easily.  Psychiatric/Behavioral: Negative for depression and suicidal ideas. The patient is not nervous/anxious and does not have insomnia.      Objective  Vitals:   12/30/16 1056  BP: 122/82  Pulse: 72  SpO2: 100%  Weight: 128 lb 6.4 oz (58.2 kg)  Height: 5\' 2"  (1.575 m)    Physical Exam  Constitutional: She is well-developed, well-nourished, and in no distress. No distress.  HENT:  Head: Normocephalic and atraumatic.  Right Ear: External ear normal.  Left Ear: External ear normal.  Nose: Nose normal.  Mouth/Throat: Oropharynx is clear and moist.  Eyes: Conjunctivae and EOM are normal. Pupils are equal, round, and reactive to light. Right eye exhibits no discharge. Left eye exhibits no discharge.  Neck: Normal range of motion. Neck supple. No JVD present. No thyromegaly present.  Cardiovascular: Normal rate, regular rhythm, normal heart sounds and intact distal pulses.  Exam reveals no gallop and no friction rub.   No murmur heard. Pulmonary/Chest: Effort normal and breath sounds normal. She has no wheezes. She has no rales.  Abdominal: Soft. Bowel sounds are normal. She exhibits no mass. There is no hepatosplenomegaly. There is no tenderness. There is no rebound, no guarding and no CVA tenderness.  Genitourinary: Rectal exam shows external hemorrhoid. Rectal exam shows no laceration, no mass, no tenderness and guaiac negative stool.  Musculoskeletal: Normal range of motion. She exhibits no edema.       Left foot: There is tenderness and deformity.       Feet:  Abnormal nail growth left great toe  Lymphadenopathy:    She has no cervical adenopathy.  Neurological: She is alert. She has normal reflexes.  Skin: Skin is warm and dry. She is not diaphoretic.  Psychiatric: Mood and affect normal.  Nursing note and vitals  reviewed.     Assessment & Plan  Problem List Items Addressed This Visit    None    Visit Diagnoses    Right upper quadrant abdominal pain    -  Primary   Relevant Medications   ranitidine (ZANTAC) 150 MG capsule   Other Relevant Orders   Hepatic Function Panel (6)   Lipase   CT Abdomen Pelvis Wo Contrast   Steatosis, liver       Relevant Orders   Hepatic Function Panel (6)   Lipase   Dystrophic nail       Relevant Orders   Ambulatory referral to Podiatry      Meds ordered this encounter  Medications  . ranitidine (ZANTAC) 150 MG capsule    Sig: Take 1 capsule (150 mg total) by mouth 2 (two) times daily.    Dispense:  60 capsule    Refill:  1      Dr. Otilio Miu [facility]  12/30/16

## 2016-12-30 NOTE — Addendum Note (Signed)
Addended by: Fredderick Severance on: 12/30/2016 02:10 PM   Modules accepted: Orders

## 2016-12-31 LAB — HEPATIC FUNCTION PANEL (6)
ALBUMIN: 4.3 g/dL (ref 3.6–4.8)
ALK PHOS: 83 IU/L (ref 39–117)
ALT: 16 IU/L (ref 0–32)
AST: 20 IU/L (ref 0–40)
Bilirubin Total: 0.3 mg/dL (ref 0.0–1.2)
Bilirubin, Direct: 0.09 mg/dL (ref 0.00–0.40)

## 2016-12-31 LAB — LIPASE: LIPASE: 47 U/L (ref 14–72)

## 2017-01-05 ENCOUNTER — Other Ambulatory Visit: Payer: Self-pay

## 2017-01-05 ENCOUNTER — Telehealth: Payer: Self-pay

## 2017-01-05 NOTE — Telephone Encounter (Signed)
Called pt's daughter concerning PA for CT scan scheduled. Pt is supposed to be seeing [facility] PCP. Please cancel CT and pt will need to get in with [facility]CP in [facility]

## 2017-01-12 ENCOUNTER — Ambulatory Visit: Payer: BLUE CROSS/BLUE SHIELD

## 2017-01-24 ENCOUNTER — Ambulatory Visit: Payer: Managed Care, Other (non HMO) | Admitting: Family Medicine

## 2017-01-27 ENCOUNTER — Encounter: Payer: Self-pay | Admitting: *Deleted

## 2017-01-27 ENCOUNTER — Ambulatory Visit
Admission: EM | Admit: 2017-01-27 | Discharge: 2017-01-27 | Disposition: A | Discharge status: Home / Self Care | Payer: Managed Care, Other (non HMO) | Attending: Family Medicine | Admitting: Family Medicine

## 2017-01-27 DIAGNOSIS — J0100 Acute maxillary sinusitis, unspecified: Secondary | ICD-10-CM

## 2017-01-27 MED ORDER — FLUTICASONE PROPIONATE 50 MCG/ACT NA SUSP: 2.0000 | Freq: Every day | NASAL | 0 refills | Status: AC

## 2017-01-27 MED ORDER — AMOXICILLIN 875 MG PO TABS: 875.0000 mg | ORAL_TABLET | Freq: Two times a day (BID) | ORAL | 0 refills | Status: DC

## 2017-01-27 NOTE — ED Triage Notes (Signed)
Patient started having symptoms of cough, sore throat, nasal drainage, dizziness, and fatigue 10 days ago. Symptoms have progressively worsened.

## 2017-01-27 NOTE — ED Provider Notes (Signed)
[facility]    CSN: [id number] Arrival date & time: 01/27/17  1207     History   Chief Complaint Chief Complaint  Patient presents with  . Cough  . Sore Throat  . Nasal Congestion  . Dizziness    HPI Tiffany Clements is a 61 y.o. female.   The history is provided by the patient.  Cough  Associated symptoms: fever and headaches   Associated symptoms: no wheezing   Sore Throat  Associated symptoms include headaches.  Dizziness  Associated symptoms: headaches   URI  Presenting symptoms: congestion, cough, facial pain, fatigue and fever   Severity:  Moderate Onset quality:  Sudden Duration:  10 days Timing:  Constant Progression:  Worsening Chronicity:  New Worsened by:  Nothing Ineffective treatments:  OTC medications Associated symptoms: headaches and sinus pain   Associated symptoms: no wheezing   Risk factors: not elderly, no chronic cardiac disease, no chronic kidney disease, no chronic respiratory disease, no diabetes mellitus, no immunosuppression, no recent illness, no recent travel and no sick contacts     Past Medical History:  Diagnosis Date  . Allergy   . Hepatitis B carrier [facility])     Patient Active Problem List   Diagnosis Date Noted  . Special screening for malignant neoplasms, colon   . Benign neoplasm of sigmoid colon   . Benign neoplasm of cecum     Past Surgical History:  Procedure Laterality Date  . BREAST BIOPSY Left    negx2  . BREAST BIOPSY Right    neg  . BREAST SURGERY     fibroid tumor excision  . COLONOSCOPY WITH PROPOFOL N/A 05/09/2016   Procedure: COLONOSCOPY WITH PROPOFOL;  Surgeon: Lucilla Lame, MD;  Location: [place];  Service: Endoscopy;  Laterality: N/A;  . POLYPECTOMY  05/09/2016   Procedure: POLYPECTOMY;  Surgeon: Lucilla Lame, MD;  Location: [place];  Service: Endoscopy;;    OB History    No data available       Home Medications    Prior to Admission medications   Medication Sig  Start Date End Date Taking? Authorizing Provider  cetirizine (ZYRTEC) 10 MG tablet Take 10 mg by mouth daily.   Yes Historical Provider, MD  ranitidine (ZANTAC) 150 MG capsule Take 1 capsule (150 mg total) by mouth 2 (two) times daily. 12/30/16  Yes Juline Patch, MD  amoxicillin (AMOXIL) 875 MG tablet Take 1 tablet (875 mg total) by mouth 2 (two) times daily. 01/27/17   Norval Gable, MD  etodolac (LODINE) 400 MG tablet Take 1 tablet (400 mg total) by mouth 2 (two) times daily. Patient not taking: Reported on 12/30/2016 11/17/16   Juline Patch, MD  fluticasone [facility]) 50 MCG/ACT nasal spray Place 2 sprays into both nostrils daily. 01/27/17   Norval Gable, MD  linaclotide [employer] [facility]) 145 MCG CAPS capsule Take 1 capsule (145 mcg total) by mouth daily. Patient not taking: Reported on 12/30/2016 05/16/16   Lucilla Lame, MD  loratadine (CLARITIN) 10 MG tablet Take 10 mg by mouth daily. Reported on 05/05/2016    Historical Provider, MD  predniSONE (DELTASONE) 10 MG tablet Take 1 tablet (10 mg total) by mouth daily with breakfast. Patient not taking: Reported on 12/30/2016 11/17/16   Juline Patch, MD    Family History Family History  Problem Relation Age of Onset  . Breast cancer Sister 24    Social History Social History  Substance Use Topics  . Smoking status: Never  Smoker  . Smokeless tobacco: Never Used  . Alcohol use No     Allergies   Patient has no known allergies.   Review of Systems Review of Systems  Constitutional: Positive for fatigue and fever.  HENT: Positive for congestion and sinus pain.   Respiratory: Positive for cough. Negative for wheezing.   Neurological: Positive for dizziness and headaches.     Physical Exam Triage Vital Signs ED Triage Vitals  Enc Vitals Group     BP 01/27/17 1219 (!) 141/74     Pulse Rate 01/27/17 1219 64     Resp 01/27/17 1219 16     Temp 01/27/17 1219 98 F (36.7 C)     Temp Source 01/27/17 1219 Oral     SpO2 01/27/17 1219 100 %      Weight 01/27/17 1220 125 lb (56.7 kg)     Height 01/27/17 1220 5\' 2"  (1.575 m)     Head Circumference --      Peak Flow --      Pain Score 01/27/17 1222 4     Pain Loc --      Pain Edu? --      Excl. in [place]? --    No data found.   Updated Vital Signs BP (!) 141/74 (BP Location: Left Arm)   Pulse 64   Temp 98 F (36.7 C) (Oral)   Resp 16   Ht 5\' 2"  (1.575 m)   Wt 125 lb (56.7 kg)   SpO2 100%   BMI 22.86 kg/m   Visual Acuity Right Eye Distance:   Left Eye Distance:   Bilateral Distance:    Right Eye Near:   Left Eye Near:    Bilateral Near:     Physical Exam  Constitutional: She appears well-developed and well-nourished. No distress.  HENT:  Head: Normocephalic and atraumatic.  Right Ear: Tympanic membrane, external ear and ear canal normal.  Left Ear: Tympanic membrane, external ear and ear canal normal.  Nose: Mucosal edema and rhinorrhea present. No nose lacerations, sinus tenderness, nasal deformity, septal deviation or nasal septal hematoma. No epistaxis.  No foreign bodies. Right sinus exhibits maxillary sinus tenderness and frontal sinus tenderness. Left sinus exhibits maxillary sinus tenderness and frontal sinus tenderness.  Mouth/Throat: Uvula is midline, oropharynx is clear and moist and mucous membranes are normal. No oropharyngeal exudate.  Eyes: Conjunctivae and EOM are normal. Pupils are equal, round, and reactive to light. Right eye exhibits no discharge. Left eye exhibits no discharge. No scleral icterus.  Neck: Normal range of motion. Neck supple. No thyromegaly present.  Cardiovascular: Normal rate, regular rhythm and normal heart sounds.   Pulmonary/Chest: Effort normal and breath sounds normal. No respiratory distress. She has no wheezes. She has no rales.  Lymphadenopathy:    She has no cervical adenopathy.  Skin: She is not diaphoretic.  Nursing note and vitals reviewed.    UC Treatments / Results  Labs (all labs ordered are listed, but only  abnormal results are displayed) Labs Reviewed - No data to display  EKG  EKG Interpretation None       Radiology No results found.  Procedures Procedures (including critical care time)  Medications Ordered in UC Medications - No data to display   Initial Impression / Assessment and Plan / UC Course  I have reviewed the triage vital signs and the nursing notes.  Pertinent labs & imaging results that were available during my care of the patient were reviewed by me and  considered in my medical decision making (see chart for details).       Final Clinical Impressions(s) / UC Diagnoses   Final diagnoses:  Acute maxillary sinusitis, recurrence not specified    New Prescriptions Discharge Medication List as of 01/27/2017 12:59 PM    START taking these medications   Details  amoxicillin (AMOXIL) 875 MG tablet Take 1 tablet (875 mg total) by mouth 2 (two) times daily., Starting Fri 01/27/2017, Normal    fluticasone (FLONASE) 50 MCG/ACT nasal spray Place 2 sprays into both nostrils daily., Starting Fri 01/27/2017, Normal       1. diagnosis reviewed with patient 2. rx as per orders above; reviewed possible side effects, interactions, risks and benefits  3.Follow-up prn if symptoms worsen or don't improve   Norval Gable, MD 01/27/17 1315

## 2017-04-27 ENCOUNTER — Ambulatory Visit
Admission: EM | Admit: 2017-04-27 | Discharge: 2017-04-27 | Disposition: A | Discharge status: Home / Self Care | Payer: Managed Care, Other (non HMO) | Attending: Family Medicine | Admitting: Family Medicine

## 2017-04-27 DIAGNOSIS — W268XXA Contact with other sharp object(s), not elsewhere classified, initial encounter: Secondary | ICD-10-CM | POA: Diagnosis not present

## 2017-04-27 DIAGNOSIS — S61210A Laceration without foreign body of right index finger without damage to nail, initial encounter: Secondary | ICD-10-CM

## 2017-04-27 DIAGNOSIS — Z23 Encounter for immunization: Secondary | ICD-10-CM | POA: Diagnosis not present

## 2017-04-27 MED ORDER — TETANUS-DIPHTH-ACELL PERTUSSIS 5-2.5-18.5 LF-MCG/0.5 IM SUSP
0.5000 mL | Freq: Once | INTRAMUSCULAR | Status: AC
  Administered 2017-04-27: 0.5 mL via INTRAMUSCULAR

## 2017-04-27 NOTE — ED Provider Notes (Signed)
[facility]    CSN: [id number] Arrival date & time: 04/27/17  1025     History   Chief Complaint Chief Complaint  Patient presents with  . Laceration    HPI Tiffany Clements is a 61 y.o. female.   61 yo female with a c/o right index finger laceration sustained with a can this morning at home. Does not recall her last tetanus vaccine.    The history is provided by the patient.  Laceration    Past Medical History:  Diagnosis Date  . Allergy   . Hepatitis B carrier [facility])     Patient Active Problem List   Diagnosis Date Noted  . Special screening for malignant neoplasms, colon   . Benign neoplasm of sigmoid colon   . Benign neoplasm of cecum     Past Surgical History:  Procedure Laterality Date  . BREAST BIOPSY Left    negx2  . BREAST BIOPSY Right    neg  . BREAST SURGERY     fibroid tumor excision  . COLONOSCOPY WITH PROPOFOL N/A 05/09/2016   Procedure: COLONOSCOPY WITH PROPOFOL;  Surgeon: Lucilla Lame, MD;  Location: [place];  Service: Endoscopy;  Laterality: N/A;  . POLYPECTOMY  05/09/2016   Procedure: POLYPECTOMY;  Surgeon: Lucilla Lame, MD;  Location: [place];  Service: Endoscopy;;    OB History    No data available       Home Medications    Prior to Admission medications   Medication Sig Start Date End Date Taking? Authorizing Provider  cetirizine (ZYRTEC) 10 MG tablet Take 10 mg by mouth daily.    [provider]  etodolac (LODINE) 400 MG tablet Take 1 tablet (400 mg total) by mouth 2 (two) times daily. Patient not taking: Reported on 12/30/2016 11/17/16   Juline Patch, MD  fluticasone [facility]) 50 MCG/ACT nasal spray Place 2 sprays into both nostrils daily. 01/27/17   Norval Gable, MD  linaclotide [facility]) 145 MCG CAPS capsule Take 1 capsule (145 mcg total) by mouth daily. Patient not taking: Reported on 12/30/2016 05/16/16   Lucilla Lame, MD  loratadine (CLARITIN) 10 MG tablet Take 10 mg by mouth daily.  Reported on 05/05/2016    [provider]  predniSONE (DELTASONE) 10 MG tablet Take 1 tablet (10 mg total) by mouth daily with breakfast. Patient not taking: Reported on 12/30/2016 11/17/16   Juline Patch, MD  ranitidine (ZANTAC) 150 MG capsule Take 1 capsule (150 mg total) by mouth 2 (two) times daily. 12/30/16   Juline Patch, MD    Family History Family History  Problem Relation Age of Onset  . Breast cancer Sister 66    Social History Social History  Substance Use Topics  . Smoking status: Never Smoker  . Smokeless tobacco: Never Used  . Alcohol use No     Allergies   Patient has no known allergies.   Review of Systems Review of Systems   Physical Exam Triage Vital Signs ED Triage Vitals  Enc Vitals Group     BP 04/27/17 1039 (!) 142/85     Pulse Rate 04/27/17 1039 75     Resp 04/27/17 1039 18     Temp 04/27/17 1039 97.8 F (36.6 C)     Temp Source 04/27/17 1039 Oral     SpO2 04/27/17 1039 100 %     Weight 04/27/17 1037 121 lb (54.9 kg)     Height 04/27/17 1037 5\' 2"  (1.575 m)  Head Circumference --      Peak Flow --      Pain Score 04/27/17 1038 4     Pain Loc --      Pain Edu? --      Excl. in [place]? --    No data found.   Updated Vital Signs BP (!) 142/85 (BP Location: Left Arm)   Pulse 75   Temp 97.8 F (36.6 C) (Oral)   Resp 18   Ht 5\' 2"  (1.575 m)   Wt 121 lb (54.9 kg)   SpO2 100%   BMI 22.13 kg/m   Visual Acuity Right Eye Distance:   Left Eye Distance:   Bilateral Distance:    Right Eye Near:   Left Eye Near:    Bilateral Near:     Physical Exam  Constitutional: She appears well-developed and well-nourished. No distress.  Musculoskeletal:       Right hand: She exhibits laceration (1cm superficial skin laceration to dorsal area of right index finger; normal range of motion and strength).       Hands: Skin: She is not diaphoretic.  Nursing note and vitals reviewed.    UC Treatments / Results  Labs (all labs ordered  are listed, but only abnormal results are displayed) Labs Reviewed - No data to display  EKG  EKG Interpretation None       Radiology No results found.  Procedures .Marland KitchenLaceration Repair Date/Time: 04/27/2017 3:27 PM Performed by: Norval Gable Authorized by: Norval Gable   Consent:    Consent obtained:  Verbal   Consent given by:  Patient   Risks discussed:  Infection, need for additional repair, poor wound healing, retained foreign body, tendon damage, vascular damage, poor cosmetic result, pain and nerve damage   Alternatives discussed:  No treatment Anesthesia (see MAR for exact dosages):    Anesthesia method:  None Laceration details:    Location:  Finger   Finger location:  R index finger   Length (cm):  1 Repair type:    Repair type:  Simple Exploration:    Hemostasis achieved with:  Direct pressure   Wound exploration: wound explored through full range of motion     Wound extent: areolar tissue violated     Wound extent: no foreign bodies/material noted     Contaminated: no   Treatment:    Area cleansed with:  Hibiclens   Amount of cleaning:  Standard   Irrigation solution:  Sterile saline Skin repair:    Repair method:  Tissue adhesive Approximation:    Approximation:  Close Post-procedure details:    Dressing:  Open (no dressing)   Patient tolerance of procedure:  Tolerated well, no immediate complications   (including critical care time)  Medications Ordered in UC Medications  Tdap (BOOSTRIX) injection 0.5 mL (0.5 mLs Intramuscular Given 04/27/17 1048)     Initial Impression / Assessment and Plan / UC Course  I have reviewed the triage vital signs and the nursing notes.  Pertinent labs & imaging results that were available during my care of the patient were reviewed by me and considered in my medical decision making (see chart for details).       Final Clinical Impressions(s) / UC Diagnoses   Final diagnoses:  Laceration of right index  finger without foreign body without damage to nail, initial encounter    New Prescriptions Discharge Medication List as of 04/27/2017 11:07 AM     1.  diagnosis reviewed with patient 2. Wound cleaned, repaired  with tissue adhesive as per procedure noted above and patient given tetanus vaccine 3. Recommend supportive treatment with routine wound care 4. Follow-up prn if symptoms worsen or don't improve   Norval Gable, MD 04/27/17 1528

## 2017-04-27 NOTE — ED Triage Notes (Signed)
Pt cut her right index finger this morning with a can.

## 2018-01-06 IMAGING — CR DG LUMBAR SPINE COMPLETE 4+V
5 series · 6 of 6 positions shown · non-contrast
Comparison: None.

CLINICAL DATA: Low back pain for several days, no known injury,
initial encounter

EXAM:
LUMBAR SPINE - COMPLETE 4+ VIEW

[l-spine ap]
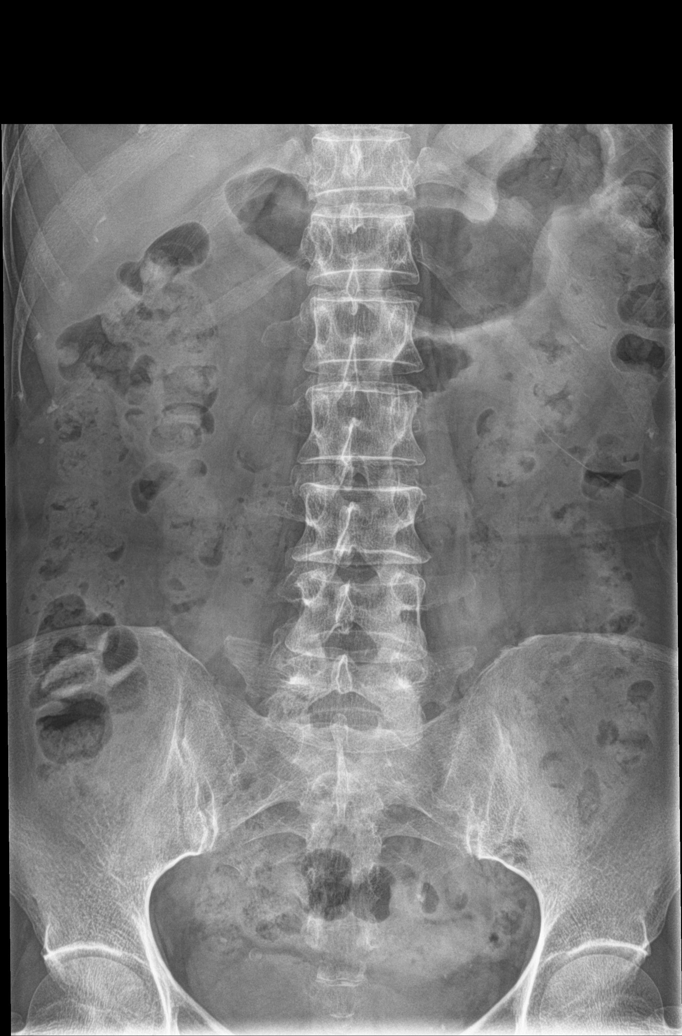

[l-spine obl (1 of 2)]
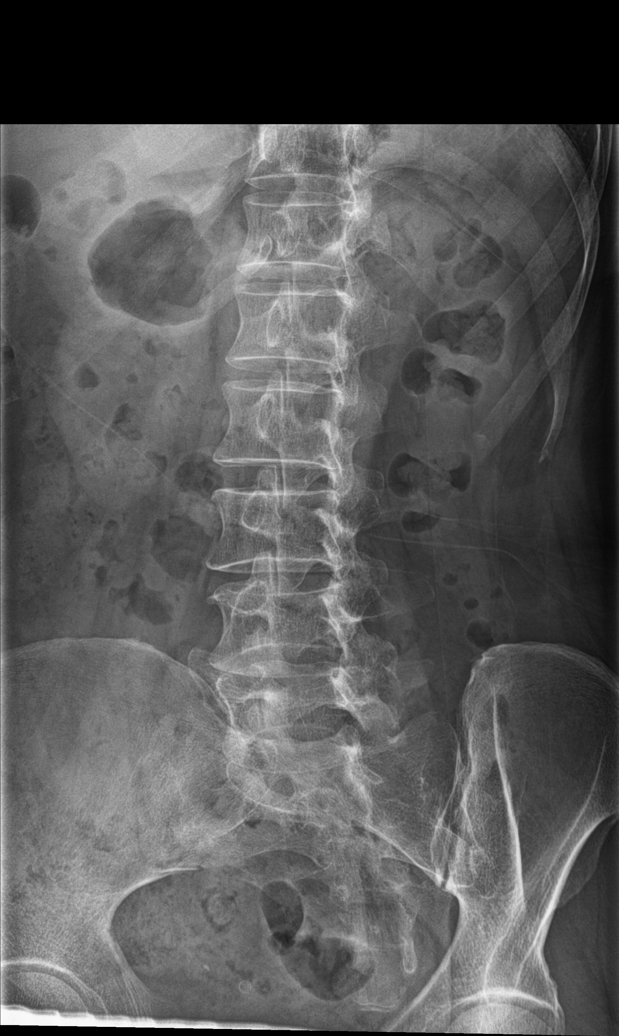

[Series 3: l-spine obl · 0.14mm/px · 2 of 2 slices shown (2 of 2)]
[im 1/2]
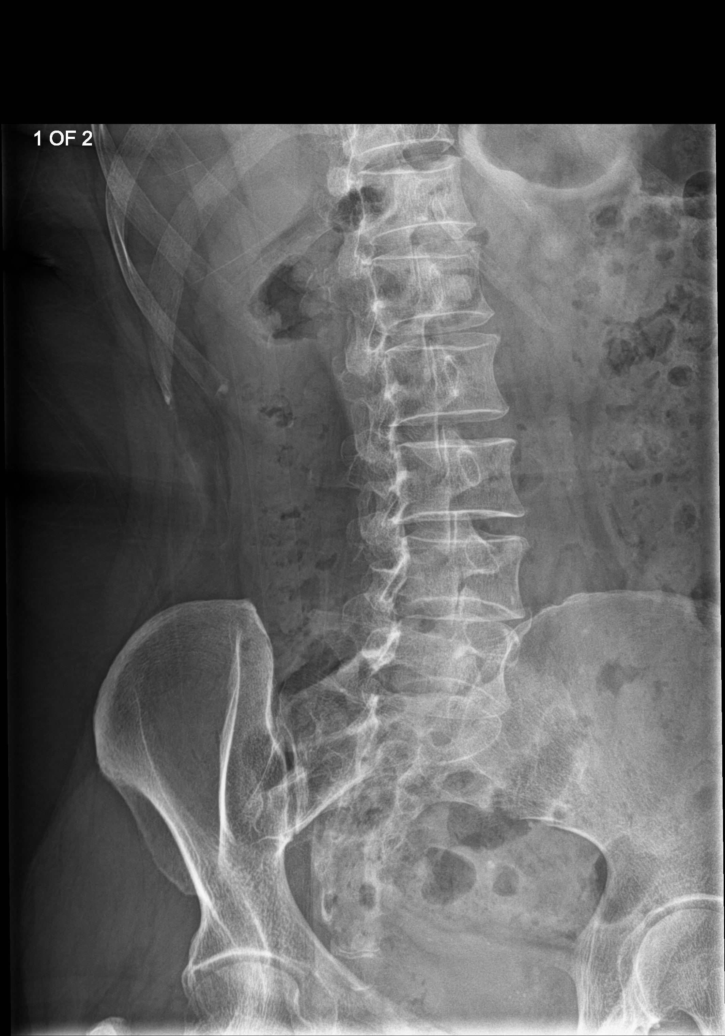
[im 2/2]
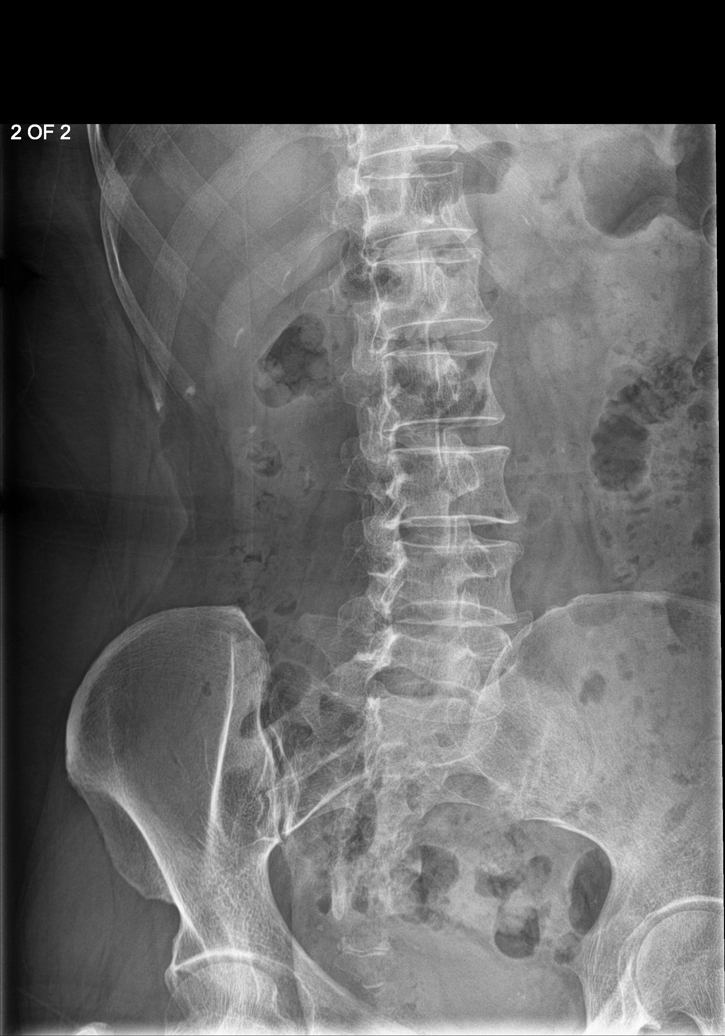

[l-spine lat]
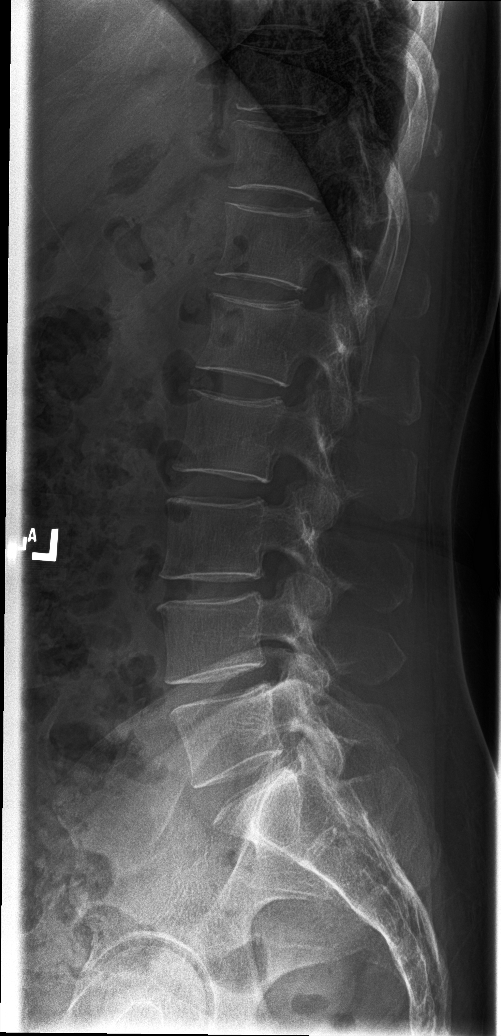

[l-spine spot]
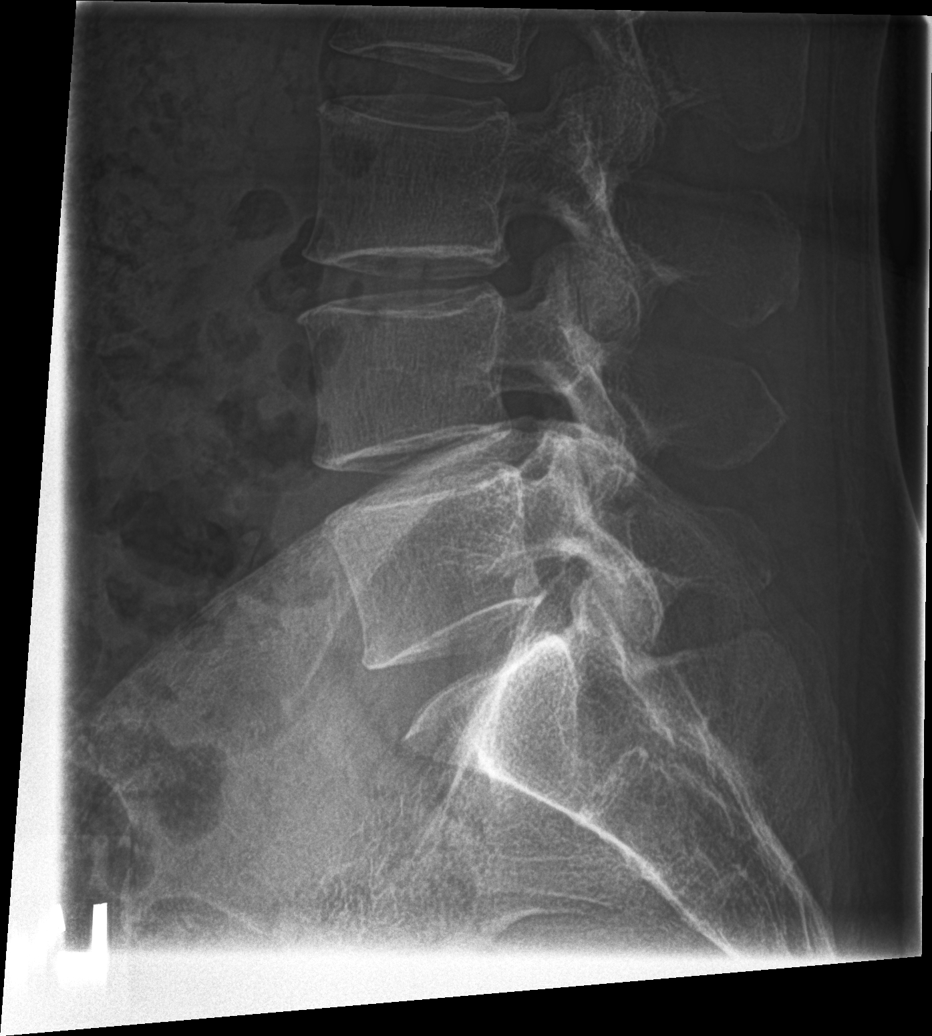

[6 of 6 positions shown; findings below may reference images not displayed]

FINDINGS: Five lumbar type vertebral bodies are well visualized. Vertebral
body height is well maintained. No pars defects are noted. No
spondylolisthesis is seen. No gross soft tissue abnormality is
noted. Disc space narrowing at L4-5 is seen.
IMPRESSION: Mild degenerative change without acute abnormality.
